# Patient Record
Sex: Female | Born: 1975 | Race: Black or African American | Hispanic: No | Marital: Single | Smoking: Former smoker
Health system: Southern US, Community
[De-identification: ages and names within clinical notes are randomized; demographics above are authoritative.]

## PROBLEM LIST (undated history)

## (undated) ENCOUNTER — Inpatient Hospital Stay (HOSPITAL_COMMUNITY): Payer: Self-pay

## (undated) DIAGNOSIS — R87619 Unspecified abnormal cytological findings in specimens from cervix uteri: Secondary | ICD-10-CM

## (undated) DIAGNOSIS — G932 Benign intracranial hypertension: Secondary | ICD-10-CM

## (undated) DIAGNOSIS — B999 Unspecified infectious disease: Secondary | ICD-10-CM

## (undated) DIAGNOSIS — IMO0002 Reserved for concepts with insufficient information to code with codable children: Secondary | ICD-10-CM

## (undated) HISTORY — PX: WISDOM TOOTH EXTRACTION: SHX21

## (undated) HISTORY — PX: COLPOSCOPY: SHX161

## (undated) HISTORY — PX: INDUCED ABORTION: SHX677

## (undated) HISTORY — PX: DILATION AND CURETTAGE OF UTERUS: SHX78

## (undated) HISTORY — PX: LUMBAR PUNCTURE: SHX1985

---

## 1998-10-12 ENCOUNTER — Emergency Department (HOSPITAL_COMMUNITY): Admission: EM | Admit: 1998-10-12 | Discharge: 1998-10-12 | Payer: Self-pay | Admitting: Emergency Medicine

## 1999-01-31 ENCOUNTER — Inpatient Hospital Stay: Admission: AD | Admit: 1999-01-31 | Discharge: 1999-01-31 | Payer: Self-pay | Admitting: Obstetrics

## 1999-03-03 ENCOUNTER — Inpatient Hospital Stay (HOSPITAL_COMMUNITY): Admission: AD | Admit: 1999-03-03 | Discharge: 1999-03-03 | Payer: Self-pay | Admitting: Obstetrics

## 1999-05-20 ENCOUNTER — Emergency Department (HOSPITAL_COMMUNITY): Admission: EM | Admit: 1999-05-20 | Discharge: 1999-05-20 | Payer: Self-pay | Admitting: *Deleted

## 1999-05-20 ENCOUNTER — Encounter: Payer: Self-pay | Admitting: Emergency Medicine

## 1999-05-26 ENCOUNTER — Emergency Department (HOSPITAL_COMMUNITY): Admission: EM | Admit: 1999-05-26 | Discharge: 1999-05-26 | Payer: Self-pay | Admitting: Emergency Medicine

## 1999-05-30 ENCOUNTER — Inpatient Hospital Stay (HOSPITAL_COMMUNITY): Admission: AD | Admit: 1999-05-30 | Discharge: 1999-05-30 | Payer: Self-pay | Admitting: Obstetrics

## 1999-12-30 ENCOUNTER — Inpatient Hospital Stay (HOSPITAL_COMMUNITY): Admission: AD | Admit: 1999-12-30 | Discharge: 1999-12-30 | Payer: Self-pay | Admitting: Obstetrics

## 2000-01-19 ENCOUNTER — Inpatient Hospital Stay (HOSPITAL_COMMUNITY): Admission: AD | Admit: 2000-01-19 | Discharge: 2000-01-19 | Payer: Self-pay | Admitting: Obstetrics

## 2000-02-12 ENCOUNTER — Inpatient Hospital Stay (HOSPITAL_COMMUNITY): Admission: AD | Admit: 2000-02-12 | Discharge: 2000-02-12 | Payer: Self-pay | Admitting: Obstetrics

## 2000-03-11 ENCOUNTER — Inpatient Hospital Stay (HOSPITAL_COMMUNITY): Admission: AD | Admit: 2000-03-11 | Discharge: 2000-03-11 | Payer: Self-pay | Admitting: Obstetrics

## 2000-04-21 ENCOUNTER — Encounter: Payer: Self-pay | Admitting: Emergency Medicine

## 2000-04-21 ENCOUNTER — Emergency Department (HOSPITAL_COMMUNITY): Admission: EM | Admit: 2000-04-21 | Discharge: 2000-04-21 | Payer: Self-pay | Admitting: Emergency Medicine

## 2000-04-24 ENCOUNTER — Emergency Department (HOSPITAL_COMMUNITY): Admission: EM | Admit: 2000-04-24 | Discharge: 2000-04-24 | Payer: Self-pay | Admitting: Emergency Medicine

## 2000-08-14 ENCOUNTER — Other Ambulatory Visit: Admission: RE | Admit: 2000-08-14 | Discharge: 2000-08-14 | Payer: Self-pay | Admitting: Obstetrics

## 2000-08-18 ENCOUNTER — Inpatient Hospital Stay (HOSPITAL_COMMUNITY): Admission: AD | Admit: 2000-08-18 | Discharge: 2000-08-18 | Payer: Self-pay | Admitting: Obstetrics

## 2000-10-17 ENCOUNTER — Emergency Department (HOSPITAL_COMMUNITY): Admission: EM | Admit: 2000-10-17 | Discharge: 2000-10-17 | Payer: Self-pay | Admitting: Emergency Medicine

## 2000-10-17 ENCOUNTER — Encounter: Payer: Self-pay | Admitting: Emergency Medicine

## 2001-10-01 ENCOUNTER — Inpatient Hospital Stay (HOSPITAL_COMMUNITY): Admission: AD | Admit: 2001-10-01 | Discharge: 2001-10-01 | Payer: Self-pay | Admitting: Obstetrics

## 2001-12-18 ENCOUNTER — Encounter (INDEPENDENT_AMBULATORY_CARE_PROVIDER_SITE_OTHER): Payer: Self-pay | Admitting: Specialist

## 2001-12-18 ENCOUNTER — Ambulatory Visit (HOSPITAL_COMMUNITY): Admission: RE | Admit: 2001-12-18 | Discharge: 2001-12-18 | Payer: Self-pay | Admitting: Obstetrics

## 2003-07-17 ENCOUNTER — Inpatient Hospital Stay (HOSPITAL_COMMUNITY): Admission: AD | Admit: 2003-07-17 | Discharge: 2003-07-17 | Payer: Self-pay | Admitting: *Deleted

## 2003-08-11 ENCOUNTER — Emergency Department (HOSPITAL_COMMUNITY): Admission: EM | Admit: 2003-08-11 | Discharge: 2003-08-11 | Payer: Self-pay | Admitting: Emergency Medicine

## 2003-09-18 ENCOUNTER — Inpatient Hospital Stay (HOSPITAL_COMMUNITY): Admission: AD | Admit: 2003-09-18 | Discharge: 2003-09-18 | Payer: Self-pay | Admitting: *Deleted

## 2006-11-27 ENCOUNTER — Ambulatory Visit (HOSPITAL_COMMUNITY): Admission: RE | Admit: 2006-11-27 | Discharge: 2006-11-27 | Payer: Self-pay | Admitting: Obstetrics

## 2006-11-27 ENCOUNTER — Encounter (INDEPENDENT_AMBULATORY_CARE_PROVIDER_SITE_OTHER): Payer: Self-pay | Admitting: Obstetrics

## 2007-08-19 ENCOUNTER — Inpatient Hospital Stay (HOSPITAL_COMMUNITY): Admission: AD | Admit: 2007-08-19 | Discharge: 2007-08-19 | Payer: Self-pay | Admitting: Obstetrics

## 2008-01-15 ENCOUNTER — Inpatient Hospital Stay (HOSPITAL_COMMUNITY): Admission: AD | Admit: 2008-01-15 | Discharge: 2008-01-15 | Payer: Self-pay | Admitting: Obstetrics & Gynecology

## 2008-02-12 ENCOUNTER — Inpatient Hospital Stay (HOSPITAL_COMMUNITY): Admission: AD | Admit: 2008-02-12 | Discharge: 2008-02-12 | Payer: Self-pay | Admitting: Obstetrics & Gynecology

## 2008-04-01 ENCOUNTER — Inpatient Hospital Stay (HOSPITAL_COMMUNITY): Admission: AD | Admit: 2008-04-01 | Discharge: 2008-04-01 | Payer: Self-pay | Admitting: Obstetrics

## 2008-05-22 ENCOUNTER — Ambulatory Visit: Payer: Self-pay | Admitting: Pulmonary Disease

## 2008-05-22 DIAGNOSIS — J309 Allergic rhinitis, unspecified: Secondary | ICD-10-CM | POA: Insufficient documentation

## 2008-05-22 DIAGNOSIS — R0602 Shortness of breath: Secondary | ICD-10-CM | POA: Insufficient documentation

## 2008-05-29 ENCOUNTER — Emergency Department (HOSPITAL_COMMUNITY): Admission: EM | Admit: 2008-05-29 | Discharge: 2008-05-29 | Payer: Self-pay | Admitting: Emergency Medicine

## 2008-06-01 ENCOUNTER — Emergency Department (HOSPITAL_COMMUNITY): Admission: EM | Admit: 2008-06-01 | Discharge: 2008-06-01 | Payer: Self-pay | Admitting: Emergency Medicine

## 2008-06-27 ENCOUNTER — Inpatient Hospital Stay (HOSPITAL_COMMUNITY): Admission: AD | Admit: 2008-06-27 | Discharge: 2008-07-03 | Payer: Self-pay | Admitting: Obstetrics

## 2008-06-30 ENCOUNTER — Encounter (INDEPENDENT_AMBULATORY_CARE_PROVIDER_SITE_OTHER): Payer: Self-pay | Admitting: Obstetrics

## 2008-10-27 ENCOUNTER — Emergency Department (HOSPITAL_COMMUNITY): Admission: EM | Admit: 2008-10-27 | Discharge: 2008-10-27 | Payer: Self-pay | Admitting: Emergency Medicine

## 2008-10-30 ENCOUNTER — Emergency Department (HOSPITAL_COMMUNITY): Admission: EM | Admit: 2008-10-30 | Discharge: 2008-10-30 | Payer: Self-pay | Admitting: Emergency Medicine

## 2009-02-04 ENCOUNTER — Emergency Department (HOSPITAL_COMMUNITY): Admission: EM | Admit: 2009-02-04 | Discharge: 2009-02-04 | Payer: Self-pay | Admitting: Emergency Medicine

## 2009-03-08 ENCOUNTER — Encounter: Admission: RE | Admit: 2009-03-08 | Discharge: 2009-05-05 | Payer: Self-pay | Admitting: Neurology

## 2009-08-09 ENCOUNTER — Emergency Department (HOSPITAL_COMMUNITY): Admission: EM | Admit: 2009-08-09 | Discharge: 2009-08-09 | Payer: Self-pay | Admitting: Emergency Medicine

## 2009-08-28 ENCOUNTER — Emergency Department (HOSPITAL_COMMUNITY): Admission: EM | Admit: 2009-08-28 | Discharge: 2009-08-28 | Payer: Self-pay | Admitting: Emergency Medicine

## 2009-11-26 ENCOUNTER — Emergency Department (HOSPITAL_COMMUNITY): Admission: EM | Admit: 2009-11-26 | Discharge: 2009-11-26 | Payer: Self-pay | Admitting: Emergency Medicine

## 2009-12-18 ENCOUNTER — Emergency Department (HOSPITAL_COMMUNITY): Admission: EM | Admit: 2009-12-18 | Discharge: 2009-12-18 | Payer: Self-pay | Admitting: Emergency Medicine

## 2010-02-18 ENCOUNTER — Emergency Department (HOSPITAL_COMMUNITY): Admission: EM | Admit: 2010-02-18 | Discharge: 2010-02-18 | Payer: Self-pay | Admitting: Emergency Medicine

## 2010-06-09 NOTE — Assessment & Plan Note (Signed)
Summary: consult for dyspnea   Visit Type:  Initial Consult Referred by:  Dr. Gaynell Face PCP:  Francoise Ceo  Chief Complaint:  pulm consult....SOB...coughing..has mold in apt..reviewed meds......  History of Present Illness: the pt is a 35 y/o female who comes in today for evaluation of dyspnea.  She has had breathing issues in the past, but no history of asthma.  She describes her breathing issue as "difficulty taking a deep breath", and states this has gotten a lot worse since being pregnant.  She denies any h/o childhood asthma, and has no significant cough.  She c/o 2 block doe at moderate pace prior to pregnancy, and now only one block.  Her breathing is worse upon lying down, and she will use a fan to blow into her face.  The pt admits to weighing greater than 200 pounds even prior to her pregnancy.  She denies any pleuritic chest pain, or calf tenderness.  A lot of her complaints center around nasal congestion.  I explained to her this is very common during pregnancy.     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 in pain?   no  Vitals Entered By: Clarise Cruz Duncan Dull) (May 22, 2008 10:42 AM)                  Physical Exam  General:  obese female in nad Eyes:     PERRLA and EOMI.   Nose:     very mild septal deviation to the left, mild turbinate hypertrophy. Mouth:     clear without exudates. Neck:     no jvd, tmg, ln Lungs:     totally clear to auscultation, but decreased depth of inspiration. no wheezing slight upper airway pseudowheezing Heart:     rrr, no mrg Abdomen:     IUP present.  nontender, bs+ Extremities:     large boots in place, no calf tenderness. Neurologic:     alert and oriented, moves all 4    Pulmonary Function Test Date: 05/22/2008 Height (in.): 63 Gender: Female  Pre-Spirometry FVC    Value: 2.70 L/min   Pred: 3.38 L/min     % Pred: 79 % FEV1    Value: 2.51 L     Pred: 2.90 L     % Pred: 86 % FEV1/FVC  Value: 93 %     Evaluation: normal  Impression & Recommendations:  Problem # 1:  DYSPNEA (ICD-786.05) I have found no pulmonary pathology on exam, spirometry, or cxr.  She has excellent oxygen saturations.  I suspect her feeling of sob is due to restriction of breathing associated with her IUP and obesity.  I have asked her to sit upright as much as possible, try sleeping with head more elevated, and to lose weight as quickly as possible once her baby is born.  I would be happy to re-assess if her condition worsens or new findings present themselves.  Medications Added to Medication List This Visit: 1)  Tylox 5-500 Mg Caps (Oxycodone-acetaminophen) .... As needed for headaches 2)  Diamox Sequels 500 Mg Xr12h-cap (Acetazolamide) .... Two times a day for fluid on the brain....brain tumor....stop due to pregnancy  Other Orders: T-2 View CXR, Same Day (71020.5TC)   Patient Instructions: 1)  work on weight loss aggressively after pregnancy 2)  can try sitting and sleeping in a more upright position during pregnancy

## 2010-08-12 LAB — URINALYSIS, ROUTINE W REFLEX MICROSCOPIC
Bilirubin Urine: NEGATIVE
Glucose, UA: NEGATIVE mg/dL
Ketones, ur: NEGATIVE mg/dL
Nitrite: NEGATIVE
Protein, ur: NEGATIVE mg/dL
Specific Gravity, Urine: 1.025 (ref 1.005–1.030)
Urobilinogen, UA: 1 mg/dL (ref 0.0–1.0)
pH: 6 (ref 5.0–8.0)

## 2010-08-12 LAB — URINE MICROSCOPIC-ADD ON

## 2010-08-12 LAB — POCT PREGNANCY, URINE: Preg Test, Ur: NEGATIVE

## 2010-08-15 LAB — COMPREHENSIVE METABOLIC PANEL
ALT: 19 U/L (ref 0–35)
AST: 26 U/L (ref 0–37)
Albumin: 3.4 g/dL — ABNORMAL LOW (ref 3.5–5.2)
Alkaline Phosphatase: 54 U/L (ref 39–117)
GFR calc Af Amer: >60 mL/min (ref >60)
Potassium: 3.6 mEq/L (ref 3.5–5.1)
Sodium: 141 mEq/L (ref 135–145)
Total Protein: 7.2 g/dL (ref 6.0–8.3)

## 2010-08-15 LAB — URINE MICROSCOPIC-ADD ON

## 2010-08-15 LAB — CBC
Hemoglobin: 13.5 g/dL (ref 12.0–15.0)
Platelets: 275 10*3/uL (ref 150–400)
RDW: 17.1 % — ABNORMAL HIGH (ref 11.5–15.5)

## 2010-08-15 LAB — DIFFERENTIAL
Basophils Relative: 0 % (ref 0–1)
Eosinophils Absolute: 0.1 10*3/uL (ref 0.0–0.7)
Lymphs Abs: 1.5 10*3/uL (ref 0.7–4.0)
Monocytes Absolute: 0.4 10*3/uL (ref 0.1–1.0)
Monocytes Relative: 7 % (ref 3–12)

## 2010-08-15 LAB — URINALYSIS, ROUTINE W REFLEX MICROSCOPIC
Glucose, UA: NEGATIVE mg/dL
Hgb urine dipstick: NEGATIVE
Ketones, ur: NEGATIVE mg/dL
Protein, ur: 30 mg/dL — AB
pH: 8.5 — ABNORMAL HIGH (ref 5.0–8.0)

## 2010-08-15 LAB — RAPID URINE DRUG SCREEN, HOSP PERFORMED
Amphetamines: NOT DETECTED
Cocaine: POSITIVE — AB
Opiates: NOT DETECTED
Tetrahydrocannabinol: POSITIVE — AB

## 2010-08-15 LAB — POCT CARDIAC MARKERS
CKMB, poc: <1 ng/mL — ABNORMAL LOW (ref 1.0–8.0)
Myoglobin, poc: 44.7 ng/mL (ref 12–200)
Myoglobin, poc: 69.2 ng/mL (ref 12–200)

## 2010-08-23 LAB — CBC
HCT: 24.7 % — ABNORMAL LOW (ref 36.0–46.0)
HCT: 28.6 % — ABNORMAL LOW (ref 36.0–46.0)
HCT: 36 % (ref 36.0–46.0)
HCT: 36.1 % (ref 36.0–46.0)
Hemoglobin: 12.2 g/dL (ref 12.0–15.0)
MCHC: 33.4 g/dL (ref 30.0–36.0)
MCHC: 33.5 g/dL (ref 30.0–36.0)
MCV: 90.5 fL (ref 78.0–100.0)
MCV: 90.9 fL (ref 78.0–100.0)
MCV: 91.7 fL (ref 78.0–100.0)
MCV: 92.2 fL (ref 78.0–100.0)
Platelets: 150 10*3/uL (ref 150–400)
Platelets: 152 10*3/uL (ref 150–400)
Platelets: 179 10*3/uL (ref 150–400)
Platelets: 179 10*3/uL (ref 150–400)
Platelets: 187 10*3/uL (ref 150–400)
RDW: 15 % (ref 11.5–15.5)
RDW: 15.5 % (ref 11.5–15.5)
WBC: 10.9 10*3/uL — ABNORMAL HIGH (ref 4.0–10.5)
WBC: 8 10*3/uL (ref 4.0–10.5)

## 2010-08-23 LAB — DIFFERENTIAL
Eosinophils Relative: 1 % (ref 0–5)
Lymphocytes Relative: 28 % (ref 12–46)
Lymphs Abs: 2.3 10*3/uL (ref 0.7–4.0)

## 2010-08-23 LAB — COMPREHENSIVE METABOLIC PANEL
ALT: 20 U/L (ref 0–35)
AST: 22 U/L (ref 0–37)
AST: 32 U/L (ref 0–37)
Albumin: 1.7 g/dL — ABNORMAL LOW (ref 3.5–5.2)
Albumin: 1.8 g/dL — ABNORMAL LOW (ref 3.5–5.2)
Albumin: 2.2 g/dL — ABNORMAL LOW (ref 3.5–5.2)
Alkaline Phosphatase: 87 U/L (ref 39–117)
BUN: 7 mg/dL (ref 6–23)
BUN: 8 mg/dL (ref 6–23)
CO2: 27 mEq/L (ref 19–32)
Calcium: 7.3 mg/dL — ABNORMAL LOW (ref 8.4–10.5)
Calcium: 7.9 mg/dL — ABNORMAL LOW (ref 8.4–10.5)
Chloride: 105 mEq/L (ref 96–112)
Creatinine, Ser: 0.71 mg/dL (ref 0.4–1.2)
Creatinine, Ser: 0.74 mg/dL (ref 0.4–1.2)
Creatinine, Ser: 0.82 mg/dL (ref 0.4–1.2)
GFR calc Af Amer: >60 mL/min (ref >60)
GFR calc Af Amer: >60 mL/min (ref >60)
GFR calc non Af Amer: >60 mL/min (ref >60)
Potassium: 3.8 mEq/L (ref 3.5–5.1)
Potassium: 4.1 mEq/L (ref 3.5–5.1)
Sodium: 136 mEq/L (ref 135–145)
Sodium: 136 mEq/L (ref 135–145)
Total Bilirubin: 0.2 mg/dL — ABNORMAL LOW (ref 0.3–1.2)
Total Bilirubin: 0.4 mg/dL (ref 0.3–1.2)
Total Protein: 3.9 g/dL — ABNORMAL LOW (ref 6.0–8.3)
Total Protein: 4.6 g/dL — ABNORMAL LOW (ref 6.0–8.3)
Total Protein: 5.4 g/dL — ABNORMAL LOW (ref 6.0–8.3)

## 2010-08-23 LAB — URINALYSIS, ROUTINE W REFLEX MICROSCOPIC
Glucose, UA: NEGATIVE mg/dL
Leukocytes, UA: NEGATIVE
Protein, ur: >300 mg/dL — AB
Urobilinogen, UA: 0.2 mg/dL (ref 0.0–1.0)

## 2010-08-23 LAB — URINE MICROSCOPIC-ADD ON

## 2010-08-23 LAB — CREATININE CLEARANCE, URINE, 24 HOUR
Creatinine Clearance: 163 mL/min — ABNORMAL HIGH (ref 75–115)
Creatinine, 24H Ur: 1668 mg/d (ref 700–1800)
Urine Total Volume-CRCL: 3100 mL

## 2010-08-23 LAB — URIC ACID
Uric Acid, Serum: 4.9 mg/dL (ref 2.4–7.0)
Uric Acid, Serum: 7.1 mg/dL — ABNORMAL HIGH (ref 2.4–7.0)

## 2010-08-23 LAB — RUBELLA SCREEN: Rubella: 27.1 IU/mL — ABNORMAL HIGH

## 2010-08-23 LAB — TYPE AND SCREEN: ABO/RH(D): B POS

## 2010-08-23 LAB — RPR: RPR Ser Ql: NONREACTIVE

## 2010-08-23 LAB — PROTEIN, URINE, 24 HOUR
Collection Interval-UPROT: 24 hours
Protein, Urine: 148 mg/dL
Urine Total Volume-UPROT: 3100 mL

## 2010-08-23 LAB — HEPATITIS B SURFACE ANTIGEN: Hepatitis B Surface Ag: NEGATIVE

## 2010-09-20 NOTE — H&P (Signed)
Beverly Martinez, Beverly Martinez                 ACCOUNT NO.:  [id number]   MEDICAL RECORD NO.:  [id number]          PATIENT TYPE:  INP   LOCATION:  9372                          [facility]   PHYSICIAN:  Kathreen Cosier, M.D.DATE OF BIRTH:  12/31/1975   DATE OF ADMISSION:  06/27/2008  DATE OF DISCHARGE:                              HISTORY & PHYSICAL   The patient is a 35 year old gravida 5, para 1, EDC August 15, 2008.  The  patient was admitted with 3+ edema and elevated blood pressures.  She  had a previous history of preeclampsia at 36 weeks having a 2-pound-10-  ounce baby by C-section.   On admission, her uric acid was 4.9, SGOT 16, SGPT 14, LDH 172.  A 24-  hour urine collection revealed 4.5 g of protein.  She was started on  labetalol 200 mg p.o. b.i.d.  She also received betamethasone 12.5 IM  x2.  The last dose was on June 28, 2008.  She was seen in consult by  Maternal Fetal Medicine.  By June 29, 2008, her diastolics were 90-  98.  Ultrasound revealed estimated fetal weight of 4 pounds 3 ounces.  By June 30, 2008, at 6:00 a.m., blood pressures at 177/93, 182/94,  180/110 by midday.  She also complained of a severe headache, which was  not relieved with Tylenol with Codeine.  The patient also has a history  of pseudotumor cerebri and was seen by a neurologist in [facility] [place] and  she had had a number of spinal taps done.  At one time, she was also on  Diamox 250 p.o. b.i.d.; however, in the past month, the patient states  that her headaches have disappeared.  The headache that appeared on the  morning of June 30, 2008, could have possibly been due to her PIH or  her pseudotumor cerebri, so  it was decided she would be delivered by  repeat C-section.   PHYSICAL EXAMINATION:  GENERAL:  An obese female not in distress.  HEENT:  Negative.  LUNGS:  Clear.  HEART:  Regular rhythm.  No murmurs or gallops.  BREASTS:  No masses.  ABDOMEN:  34-36 weeks' size.  EXTREMITIES:  3+ edema.           ______________________________  Kathreen Cosier, M.D.     BAM/MEDQ  D:  06/30/2008  T:  07/01/2008  Job:  [id number]

## 2010-09-20 NOTE — Op Note (Signed)
NAMESOPHIE, Beverly Martinez                 ACCOUNT NO.:  [id number]   MEDICAL RECORD NO.:  [id number]           PATIENT TYPE:   LOCATION:                                 FACILITY:   PHYSICIAN:  Kathreen Cosier, M.D.DATE OF BIRTH:  09-16-1975   DATE OF PROCEDURE:  06/30/2008  DATE OF DISCHARGE:                               OPERATIVE REPORT   PREOPERATIVE DIAGNOSES:  1. Pregnancy-induced hypertension at 33 plus weeks.  2. History of pseudotumor cerebri.   POSTOPERATIVE DIAGNOSES:  1. Pregnancy-induced hypertension at 33 plus weeks.  2. History of pseudotumor cerebri.   SURGEON:  Kathreen Cosier, MD   FIRST ASSISTANT:  Charles A. Clearance Coots, MD   PROCEDURE:  After the spinal administered, the patient was placed in the  supine position.  Abdomen was prepped and draped.  Bladder was emptied  with Foley catheter.  Transverse suprapubic incision was made through  the old scar, carried down through rectus fascia.  Fascia was cleaned  and incised the length of incision.  Recti muscles were retracted  laterally.  Peritoneum was incised longitudinally.  Transverse incision  was made in the visceral peritoneum above the bladder.  Bladder  mobilized inferiorly.  Transverse lower uterine incision made.  The  patient delivered of a female, Apgar 8 and 9, weighing 3 pounds 10  ounces.  The fluid was clear.  The placenta was posterior and sent to  Pathology.  Uterine cavity was cleaned with dry laps.  Uterine incision  closed with interlocking suture of #1 chromic.  Hemostasis satisfactory.  Bladder flap reattached with 2-0 chromic.  Uterus was well contracted.  Tubes and ovaries normal.  Abdomen closed in layers, peritoneum closed  with continuous suture of 0 chromic, fascia continuous suture of 0  Dexon, and the skin closed with subcuticular suture of 4-0 Monocryl.   BLOOD LOSS:  500 mL.           ______________________________  Kathreen Cosier, M.D.     BAM/MEDQ  D:  06/30/2008   T:  07/01/2008  Job:  [id number]

## 2010-09-23 NOTE — Op Note (Signed)
   [name], Beverly Martinez                             ACCOUNT NO.:  [id number]   MEDICAL RECORD NO.:  [id number]                   PATIENT TYPE:  AMB   LOCATION:  SDC                                  [facility]  [facility]   PHYSICIAN:  Kathreen Cosier, M.D.           DATE OF BIRTH:  Sep 25, 1975   DATE OF PROCEDURE:  12/18/2001  DATE OF DISCHARGE:                                 OPERATIVE REPORT   PREOPERATIVE DIAGNOSIS:  Severe dysplasia of the cervix.   PROCEDURE:  Cold knife conization.   Under general anesthesia with the patient  in the lithotomy position the  perineum and vagina prepped and draped. Bladder emptied with a straight  catheter. A weighted speculum was placed in the vagina. Hemostatic suture of  #1 chromic was placed at 3 and 9 o'clock high up on the lateral aspect of  the cervix then a cold knife cone done in the usual manner. Hemostasis was  achieved with suture of #1 around the cervix. The cervical canal was sounded  and found to be patent. The patient tolerated the procedure well and taken  to the recovery room in good condition.                                               Kathreen Cosier, M.D.    BAM/MEDQ  D:  12/18/2001  T:  12/19/2001  Job:  [id number]

## 2010-09-23 NOTE — Discharge Summary (Signed)
[name], [name]                 ACCOUNT NO.:  [id number]   MEDICAL RECORD NO.:  [id number]          PATIENT TYPE:  INP   LOCATION:  9309                          [facility]   PHYSICIAN:  Kathreen Cosier, M.D.DATE OF BIRTH:  28-Aug-1975   DATE OF ADMISSION:  06/27/2008  DATE OF DISCHARGE:  07/03/2008                               DISCHARGE SUMMARY   The patient is a 35 year old gravida 5, para 1, EDC on April 2010. She  had a previous C-section because of preeclampsia at 36 weeks and had 2  pounds 10 ounces baby.  She was now admitted with 3+ edema and elevated  blood pressures.  Her uric acid was 4.9, SGOT 16, PT 14, and LDH 172.  A  24-hour urine collection revealed 4.5 grams of protein and she was  started on labetalol 200 p.o. b.i.d.  She received betamethasone 12.5 IM  x2 and she was seen in consult by MFM.  She also had a history of  pseudotumor cerebri and throughout the pregnancy had received a number  of spinal taps by the neurologist in [facility].  Her headaches have  resolved.  By February 23, her diastolics were over 100 and she had a  severe headache which could have been due to [facility] or pseudotumor.  It was  decided to deliver by repeat C-section and she had a female, Apgar 8 and  9, weighing 3 pounds 10 ounces.  She was placed on Diamox 250 p.o.  b.i.d., labetalol 200 b.i.d. and her magnesium was discontinued on day  1.  By date 2, blood pressure was 120/78, output good.  She still had 3+  edema.  Diamox was discontinued and she was discharged on the third  postoperative day on labetalol 200 p.o. b.i.d., Tylox for pain, and to  see me in 1 week for blood pressure check.   DISCHARGE DIAGNOSIS:  Status post delivery by C-section for pregnancy-  induced hypertension.           ______________________________  Kathreen Cosier, M.D.     BAM/MEDQ  D:  07/22/2008  T:  07/22/2008  Job:  [id number]

## 2010-09-23 NOTE — Op Note (Signed)
[name], Beverly Martinez                 ACCOUNT NO.:  [id number]   MEDICAL RECORD NO.:  [id number]          PATIENT TYPE:  AMB   LOCATION:  SDC                           [facility]   PHYSICIAN:  Kathreen Cosier, M.D.DATE OF BIRTH:  November 22, 1975   DATE OF PROCEDURE:  12/19/2006  DATE OF DISCHARGE:  11/27/2006                               OPERATIVE REPORT   PREOPERATIVE DIAGNOSIS:  Severe dysplasia of cervix.   PROCEDURE:  Cold knife cone.   DESCRIPTION OF PROCEDURE:  Under general anesthesia the patient in  lithotomy position, perineum and vagina prepped and draped, bladder  emptied straight catheter.  Weighted speculum placed in the vagina.  Cervix injected with 10 mL of 1% Xylocaine and a hemostatic suture of #1  chromic placed high up on the lateral aspect of the cervix at 3 o'clock  and 9 o'clock.  Cold knife cone was done in the usual fashion and  hemostasis achieved with U sutures placed around the cervix.  The  patient tolerated the procedure well, taken to recovery room in good  condition.           ______________________________  Kathreen Cosier, M.D.     BAM/MEDQ  D:  12/19/2006  T:  12/19/2006  Job:  [id number]

## 2010-11-20 ENCOUNTER — Emergency Department (HOSPITAL_COMMUNITY)
Admission: EM | Admit: 2010-11-20 | Discharge: 2010-11-20 | Disposition: A | Discharge status: Home / Self Care | Payer: Self-pay | Attending: Emergency Medicine | Admitting: Emergency Medicine

## 2010-11-20 DIAGNOSIS — G510 Bell's palsy: Secondary | ICD-10-CM | POA: Insufficient documentation

## 2010-11-20 DIAGNOSIS — R29810 Facial weakness: Secondary | ICD-10-CM | POA: Insufficient documentation

## 2011-01-31 LAB — POCT PREGNANCY, URINE
Operator id: 140111
Preg Test, Ur: NEGATIVE

## 2011-01-31 LAB — GC/CHLAMYDIA PROBE AMP, GENITAL
Chlamydia, DNA Probe: NEGATIVE
GC Probe Amp, Genital: NEGATIVE

## 2011-02-08 LAB — WET PREP, GENITAL
Clue Cells Wet Prep HPF POC: NONE SEEN
Trich, Wet Prep: NONE SEEN
Yeast Wet Prep HPF POC: NONE SEEN

## 2011-02-08 LAB — DIFFERENTIAL
Eosinophils Absolute: 0.2
Eosinophils Relative: 3
Lymphs Abs: 1.2
Monocytes Relative: 11
Neutrophils Relative %: 66

## 2011-02-08 LAB — URINALYSIS, ROUTINE W REFLEX MICROSCOPIC
Bilirubin Urine: NEGATIVE
Hgb urine dipstick: NEGATIVE
Specific Gravity, Urine: 1.025
pH: 6.5

## 2011-02-08 LAB — CBC
HCT: 39.2
MCV: 91
RBC: 4.31
WBC: 6.2

## 2011-02-08 LAB — GC/CHLAMYDIA PROBE AMP, GENITAL: Chlamydia, DNA Probe: NEGATIVE

## 2011-02-20 LAB — PREGNANCY, URINE: Preg Test, Ur: NEGATIVE

## 2011-02-20 LAB — CBC
MCHC: 33.3
Platelets: 293
RBC: 4.53

## 2011-08-15 ENCOUNTER — Emergency Department (HOSPITAL_COMMUNITY)
Admission: EM | Admit: 2011-08-15 | Discharge: 2011-08-15 | Disposition: A | Discharge status: Home / Self Care | Payer: Self-pay | Attending: Emergency Medicine | Admitting: Emergency Medicine

## 2011-08-15 ENCOUNTER — Encounter (HOSPITAL_COMMUNITY): Payer: Self-pay

## 2011-08-15 DIAGNOSIS — R197 Diarrhea, unspecified: Secondary | ICD-10-CM | POA: Insufficient documentation

## 2011-08-15 DIAGNOSIS — R109 Unspecified abdominal pain: Secondary | ICD-10-CM | POA: Insufficient documentation

## 2011-08-15 DIAGNOSIS — R112 Nausea with vomiting, unspecified: Secondary | ICD-10-CM | POA: Insufficient documentation

## 2011-08-15 LAB — DIFFERENTIAL
Basophils Absolute: 0 10*3/uL (ref 0.0–0.1)
Basophils Relative: 0 % (ref 0–1)
Lymphocytes Relative: 22 % (ref 12–46)
Monocytes Relative: 6 % (ref 3–12)
Neutro Abs: 6.4 10*3/uL (ref 1.7–7.7)
Neutrophils Relative %: 70 % (ref 43–77)

## 2011-08-15 LAB — BASIC METABOLIC PANEL
BUN: 8 mg/dL (ref 6–23)
Chloride: 103 mEq/L (ref 96–112)
GFR calc Af Amer: >90 mL/min (ref >90)
GFR calc non Af Amer: >90 mL/min (ref >90)
Potassium: 3.4 mEq/L — ABNORMAL LOW (ref 3.5–5.1)
Sodium: 137 mEq/L (ref 135–145)

## 2011-08-15 LAB — URINALYSIS, ROUTINE W REFLEX MICROSCOPIC
Glucose, UA: NEGATIVE mg/dL
Ketones, ur: NEGATIVE mg/dL
Leukocytes, UA: NEGATIVE
Nitrite: NEGATIVE
Specific Gravity, Urine: 1.025 (ref 1.005–1.030)
pH: 6 (ref 5.0–8.0)

## 2011-08-15 LAB — CBC
Hemoglobin: 13.3 g/dL (ref 12.0–15.0)
MCHC: 33.3 g/dL (ref 30.0–36.0)
RDW: 13.8 % (ref 11.5–15.5)
WBC: 9.1 10*3/uL (ref 4.0–10.5)

## 2011-08-15 LAB — PREGNANCY, URINE: Preg Test, Ur: NEGATIVE

## 2011-08-15 MED ORDER — NAPROXEN 500 MG PO TABS
500.0000 mg | ORAL_TABLET | Freq: Once | ORAL | Status: AC
  Administered 2011-08-15: 500 mg via ORAL
  Filled 2011-08-15: qty 1

## 2011-08-15 MED ORDER — ONDANSETRON 8 MG PO TBDP: 8.0000 mg | ORAL_TABLET | Freq: Three times a day (TID) | ORAL | Status: AC | PRN

## 2011-08-15 NOTE — ED Provider Notes (Signed)
History     CSN: [id number]  Arrival date & time 08/15/11  1444   First MD Initiated Contact with Patient 08/15/11 1830      Chief Complaint  Patient presents with  . Abdominal Pain  . Diarrhea    (Consider location/radiation/quality/duration/timing/severity/associated sxs/prior treatment) HPI Comments: Patient presents with diarrhea and lower abdominal cramping since this morning. Patient also had an episode of nonbloody vomiting. Patient noticed some streaks of blood in her stool mixed with mucus. She denies recent antibiotic use, travel. Patient denies fever, cough or shortness of breath, chest pain. She denies dysuria. 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 Patient has not vomited since her arrival to the emergency department. She states that her nausea is resolved. She states that her abdominal cramping is improving. She has been able to keep down fluids.  Patient is a 36 y.o. female presenting with abdominal pain and diarrhea. The history is provided by the patient.  Abdominal Pain The primary symptoms of the illness include abdominal pain, nausea, vomiting and diarrhea. The primary symptoms of the illness do not include fever, shortness of breath, dysuria, vaginal discharge or vaginal bleeding. The current episode started 6 to 12 hours ago. The problem has been gradually improving.  Symptoms associated with the illness do not include constipation.  Diarrhea The primary symptoms include abdominal pain, nausea, vomiting and diarrhea. Primary symptoms do not include fever, dysuria, myalgias or rash.  The illness does not include constipation.    History reviewed. No pertinent past medical history.  History reviewed. No pertinent past surgical history.  No family history on file.  History  Substance Use Topics  . Smoking status: Current Everyday Smoker  . Smokeless tobacco: Not on file  . Alcohol Use: No    OB History    Grav  Para Term Preterm Abortions TAB SAB Ect Mult Living                  Review of Systems  Constitutional: Negative for fever.  HENT: Negative for sore throat and rhinorrhea.   Eyes: Negative for redness.  Respiratory: Negative for cough and shortness of breath.   Cardiovascular: Negative for chest pain.  Gastrointestinal: Positive for nausea, vomiting, abdominal pain, diarrhea and blood in stool. Negative for constipation.  Genitourinary: Negative for dysuria, vaginal bleeding and vaginal discharge.  Musculoskeletal: Negative for myalgias.  Skin: Negative for rash.  Neurological: Negative for headaches.    Allergies  Review of patient's allergies indicates no known allergies.  Home Medications  No current outpatient prescriptions on file.  BP 142/99  Pulse 86  Temp(Src) 98.6 F (37 C) (Oral)  Resp 20  SpO2 100%  LMP 07/22/2011  Physical Exam  Nursing note and vitals reviewed. Constitutional: She is oriented to person, place, and time. She appears well-developed and well-nourished.  HENT:  Head: Normocephalic and atraumatic.  Eyes: Conjunctivae are normal. Right eye exhibits no discharge. Left eye exhibits no discharge.  Neck: Normal range of motion. Neck supple.  Cardiovascular: Normal rate, regular rhythm and normal heart sounds.   Pulmonary/Chest: Effort normal and breath sounds normal.  Abdominal: Soft. Bowel sounds are normal. She exhibits no distension. There is no tenderness. There is no rebound and no guarding.  Neurological: She is alert and oriented to person, place, and time.  Skin: Skin is warm and dry.  Psychiatric: She has a normal mood and affect.    ED Course  Procedures (including critical care  time)  Labs Reviewed  BASIC METABOLIC PANEL - Abnormal; Notable for the following:    Potassium 3.4 (*)    All other components within normal limits  URINALYSIS, ROUTINE W REFLEX MICROSCOPIC  PREGNANCY, URINE  CBC  DIFFERENTIAL  LIPASE, BLOOD   No  results found.   1. Nausea vomiting and diarrhea     6:48 PM Patient seen and examined.    Vital signs reviewed and are as follows: Filed Vitals:   08/15/11 1454  BP: 142/99  Pulse: 86  Temp: 98.6 F (37 C)  Resp: 20   All labs ordered per ED protocol. Patient is improving. Will give oral trial and aleve for cramps. Anticipate d/c to home if she can tolerate.   8:11 PM Patient drank ginger ale without problems. Counseled on clear liquids, brat diet.   The patient was urged to return to the Emergency Department immediately with worsening of current symptoms, worsening abdominal pain, persistent vomiting, blood noted in stools, fever, or any other concerns. The patient verbalized understanding.   MDM  Patient with 12 hr h/o N/V/D, improving. Tolerating POs in ED. Will continue conservative management with return if worsening. No significant abdominal tenderness of exam. Does not appear clinically dehydrated. Do not suspect significant blood loss. Do not suspect IBD.         Renne Crigler, [place] 08/15/11 2013

## 2011-08-15 NOTE — ED Provider Notes (Signed)
Medical screening examination/treatment/procedure(s) were performed by non-physician practitioner and as supervising physician I was immediately available for consultation/collaboration.   Celene Kras, MD 08/15/11 [phone]

## 2011-08-15 NOTE — ED Notes (Signed)
Pt states acute onset of abd pain and diarrhea starting this am states unable to eat

## 2011-08-15 NOTE — ED Notes (Signed)
Report received from [facility].  Pt tolerating fluids well at this time.  Denies any needs or c/o.

## 2011-08-15 NOTE — Discharge Instructions (Signed)
Please read and follow all provided instructions.  Your diagnoses today include:  1. Nausea vomiting and diarrhea     Tests performed today include:  Blood counts and electrolytes  Blood tests to check liver and kidney function  Blood tests to check pancreas function  Urine test to look for infection and pregnancy (in women)  Vital signs. See below for your results today.   Medications prescribed:   Zofran (ondansetron) - for nausea and vomiting  Take any prescribed medications only as directed.  Home care instructions:   Follow any educational materials contained in this packet.   Your abdominal pain, nausea, vomiting, and diarrhea may be caused by a viral gastroenteritis also called 'stomach flu'. You should rest for the next several days. Keep drinking plenty of fluids and use the medicine for nausea as directed.    Drink clear liquids for the next 24 hours and introduce solid foods slowly after 24 hours using the b.r.a.t. diet (see attached information sheet).    Follow-up instructions: Please follow-up with your primary care provider in the next 2 days for further evaluation of your symptoms. If you are not feeling better in 48 hours you may have a condition that is more serious and you need re-evaluation. If you do not have a primary care doctor -- see below for referral information.   Return instructions:  SEEK IMMEDIATE MEDICAL ATTENTION IF:  If you have pain that does not go away or becomes severe   A temperature above 101F develops   Repeated vomiting occurs (multiple episodes)   If you have pain that becomes localized to portions of the abdomen. The right side could possibly be appendicitis. In an adult, the left lower portion of the abdomen could be colitis or diverticulitis.   Blood is being passed in stools or vomit (bright red or black tarry stools)   You develop chest pain, difficulty breathing, dizziness or fainting, or become confused, poorly  responsive, or inconsolable (young children)  If you have any other emergent concerns regarding your health  Additional Information: Abdominal (belly) pain can be caused by many things. Your caregiver performed an examination and possibly ordered blood/urine tests and imaging (CT scan, x-rays, ultrasound). Many cases can be observed and treated at home after initial evaluation in the emergency department. Even though you are being discharged home, abdominal pain can be unpredictable. Therefore, you need a repeated exam if your pain does not resolve, returns, or worsens. Most patients with abdominal pain don't have to be admitted to the hospital or have surgery, but serious problems like appendicitis and gallbladder attacks can start out as nonspecific pain. Many abdominal conditions cannot be diagnosed in one visit, so follow-up evaluations are very important.  Your vital signs today were: BP 142/99  Pulse 86  Temp(Src) 98.6 F (37 C) (Oral)  Resp 20  SpO2 100%  LMP 07/22/2011 If your blood pressure (bp) was elevated above 135/85 this visit, please have this repeated by your doctor within one month. -------------- No Primary Care Doctor Call [facility]  [phone] Other agencies that provide inexpensive medical care    [facility]  [phone]    [facility]  [phone]    [facility]  [phone]    [facility]  [phone]    [facility]  [phone]    [facility]  [phone] -------------- RESOURCE GUIDE:  Dental Problems  Patients with Medicaid: [facility]  [facility] [address]                                            [phone] [address] [employer] [address] Phone:  [phone]                                                   Phone:  [phone]  If unable to pay or uninsured, contact:  [facility] or [facility]. to become qualified for the adult dental clinic.  Chronic Pain  Problems Contact [facility]  [phone] Patients need to be referred by their primary care doctor.  Insufficient Money for Medicine Contact [employer]:  call "211" or [facility] 9065106538.  Psychological Services [facility]  [phone] [facility]  [phone] [facility]   [phone] (emergency services [phone])  Substance Abuse Resources [facility]  [phone] [facility] [phone] [facility] [phone] Floydene Flock [phone] Residential & Outpatient Substance Abuse Program  [phone]  Abuse/Neglect [facility] Child Abuse Hotline [phone] [facility] Child Abuse Hotline 301-559-0044 (After Hours)  Emergency [facility]ter [facility] Ministries [phone]  M[facility] Homes [facility] [place] [facility] [phone] [facility] [phone]  [facility] Resources  [facility]     [employer]                          [facility] Dept. [address] [place]                       [address]      [address]  Blondell Reveal Phone:  [phone]                                   Phone:  [phone]                 Phone:  [phone]  [facility] Phone:  [phone]  [facility] Child Abuse Hotline [phone] (After Hours)

## 2011-09-30 ENCOUNTER — Emergency Department (HOSPITAL_COMMUNITY)
Admission: EM | Admit: 2011-09-30 | Discharge: 2011-10-01 | Disposition: A | Discharge status: Home / Self Care | Payer: Self-pay | Attending: Emergency Medicine | Admitting: Emergency Medicine

## 2011-09-30 ENCOUNTER — Encounter (HOSPITAL_COMMUNITY): Payer: Self-pay | Admitting: Emergency Medicine

## 2011-09-30 DIAGNOSIS — L309 Dermatitis, unspecified: Secondary | ICD-10-CM

## 2011-09-30 DIAGNOSIS — F17200 Nicotine dependence, unspecified, uncomplicated: Secondary | ICD-10-CM | POA: Insufficient documentation

## 2011-09-30 DIAGNOSIS — R21 Rash and other nonspecific skin eruption: Secondary | ICD-10-CM | POA: Insufficient documentation

## 2011-09-30 HISTORY — DX: Benign intracranial hypertension: G93.2

## 2011-09-30 NOTE — ED Provider Notes (Signed)
History     CSN: [id number]  Arrival date & time 09/30/11  2334   First MD Initiated Contact with Patient 09/30/11 2347      Chief Complaint  Patient presents with  . Rash    (Consider location/radiation/quality/duration/timing/severity/associated sxs/prior treatment) HPI  36 year old female presents complaining of rash. Patient states for the past month she has been having a rash to both arms and chest. Rash as very itchy. Onset was gradual and persistent. Rash is not associated with fever, sore throat, throat swelling, chest pain, shortness of breath, nausea, vomiting. She denies any changes in detergent, or having new pets, or new medication change. She has had similar rash in the past and was diagnosed with eczema. States she was given steroid cream to use last year but it did not help. Sts rash from last year lasted for 2 months then resolved. 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 She denies history of asthma, hx of allergy or history of atopic dermatitis.  No past medical history on file.  No past surgical history on file.  No family history on file.  History  Substance Use Topics  . Smoking status: Current Everyday Smoker  . Smokeless tobacco: Not on file  . Alcohol Use: No    OB History    Grav Para Term Preterm Abortions TAB SAB Ect Mult Living                  Review of Systems  All other systems reviewed and are negative.    Allergies  Review of patient's allergies indicates no known allergies.  Home Medications  No current outpatient prescriptions on file.  LMP 09/13/2011  Physical Exam  Nursing note and vitals reviewed. Constitutional: She appears well-developed and well-nourished. No distress.       Awake, alert, nontoxic appearance  HENT:  Head: Atraumatic.  Mouth/Throat: Oropharynx is clear and moist.  Eyes: Conjunctivae are normal. Right eye exhibits no discharge. Left eye exhibits no discharge.    Neck: Neck supple.  Cardiovascular: Normal rate and regular rhythm.   Pulmonary/Chest: Effort normal. No respiratory distress. She has no wheezes. She exhibits no tenderness.  Abdominal: Soft. There is no tenderness. There is no rebound.  Musculoskeletal: She exhibits no tenderness.       ROM appears intact, no obvious focal weakness  Neurological:       Mental status and motor strength appears intact  Skin: Rash noted.       Patchy hyperpigmented rash noted to both arms and upper chest/neck region.  Non petechial, non pustular, non vesicular.  Excoriation marks noted.    Psychiatric: She has a normal mood and affect.    ED Course  Procedures (including critical care time)  Labs Reviewed - No data to display No results found.   No diagnosis found.    MDM  Rash consistent with eczema.  Will give betamethasone cream and short course of oral steroid.  Pt does not have hx of DM or GERD.  VSS, normal oxygenation.  No resp involvement.          Fayrene Helper, PA-C 10/01/11 0005

## 2011-09-30 NOTE — ED Notes (Signed)
Pt presented to the Er with c/o generalized rash and eye redness. Pt states she was seen by her dermatologist, however, rash is not subsiding.

## 2011-09-30 NOTE — ED Notes (Signed)
Pt c/o rash to bilat arms and neck, onset last year. Pt dx with eczema in past.

## 2011-10-01 MED ORDER — BETAMETHASONE DIPROPIONATE 0.05 % EX OINT: TOPICAL_OINTMENT | Freq: Two times a day (BID) | CUTANEOUS | Status: DC

## 2011-10-01 MED ORDER — PREDNISONE 10 MG PO TABS: 20.0000 mg | ORAL_TABLET | Freq: Every day | ORAL | Status: DC

## 2011-10-01 NOTE — ED Provider Notes (Signed)
Medical screening examination/treatment/procedure(s) were performed by non-physician practitioner and as supervising physician I was immediately available for consultation/collaboration.   Hanley Seamen, MD 10/01/11 [phone]

## 2011-10-01 NOTE — ED Notes (Signed)
Pt c/o rash intermittent x 1 year, pt diagnosed with eczema by derm, given cream to use, pt very upset, does not feel this is eczema. Pt tearful, PA spent extended time with patient explaining anxiety and how this relates to rash exacerbation. Pt verbalized understanding .

## 2011-10-01 NOTE — Discharge Instructions (Signed)

## 2011-10-18 ENCOUNTER — Encounter: Payer: Self-pay | Admitting: Obstetrics & Gynecology

## 2011-10-18 ENCOUNTER — Encounter: Payer: Self-pay | Admitting: Physician Assistant

## 2011-10-20 ENCOUNTER — Emergency Department (HOSPITAL_COMMUNITY)
Admission: EM | Admit: 2011-10-20 | Discharge: 2011-10-21 | Disposition: A | Discharge status: Home / Self Care | Payer: Medicaid Other | Attending: Emergency Medicine | Admitting: Emergency Medicine

## 2011-10-20 DIAGNOSIS — N760 Acute vaginitis: Secondary | ICD-10-CM | POA: Insufficient documentation

## 2011-10-20 DIAGNOSIS — F17200 Nicotine dependence, unspecified, uncomplicated: Secondary | ICD-10-CM | POA: Insufficient documentation

## 2011-10-20 NOTE — ED Notes (Signed)
PT c/o new onset vaginal discharge first noticed when period stopped. Pt c/o vaginal itching, no pain or dysuria.

## 2011-10-21 ENCOUNTER — Encounter (HOSPITAL_COMMUNITY): Payer: Self-pay | Admitting: *Deleted

## 2011-10-21 LAB — PREGNANCY, URINE: Preg Test, Ur: NEGATIVE

## 2011-10-21 LAB — WET PREP, GENITAL
Trich, Wet Prep: NONE SEEN
Yeast Wet Prep HPF POC: NONE SEEN

## 2011-10-21 NOTE — Discharge Instructions (Signed)
Your pelvic exam showed no signs of infection.  Your swab for gonorrhea and chlamydia is still running, and you will be contacted if they are positive.  It is common during the time two weeks after your period to have more vaginal discharge.  Please follow up with your gynecologist for worsening condition or new concerning symptoms.   Disorders of the Vulva It is important to know the outside (external) area of the female genitalia to properly examine yourself. The vulva or labia, sometimes also called the lips around the vagina, covers the pubic bone and surrounds the vagina. The larger or outer labia is called the labia majora. The inner or smaller labia is called the labia minora. The clitoris is at the top of the vulva. It is covered by a small area of tissue called the hood. Below the clitoris is the opening of the tube from the bladder, which is the urethral opening. Just below the urethral opening is the opening of the vagina. This area is called the vestibule. Inside the vestibule are bartholin and skene glands that lubricate the outside of the vagina during sexual intercourse. The perineum is the area that lies between the vagina and anus. You should examine your external genitalia once a month just as you do your self breast examination. SIGNS AND SYMPTOMS TO LOOK FOR DURING YOUR EXAMINATION:  Swelling.   Redness.   Bumps.   Blisters.   Black or white areas.   Itching.   Bleeding.   Burning.  TYPES OF VULVAR PROBLEMS  Infections.   Yeast (fungus) is the most common infection that causes redness, swelling, itching and a thick white vaginal discharge. Women with diabetes, taking medicines that kill germs (antibiotics) or on birth control pills are at risk for yeast infection. This infection is treated with vaginal creams, suppositories and anti-itch cream for the vulva.   Genital warts (condyloma) are caused by the human papilloma virus. They are raised bumps that can itch, bleed,  cause discomfort and sometimes appear in groups like cauliflower. This is a sexually transmitted disease (STD) caused by sexual contact. This can be treated with topical medication, freezing, electrocautery, laser or surgical removal.   Genital herpes is a virus that causes redness, swelling, blisters and ulcers that can cause itching and are very painful. This is also a STD. There are medications to control the symptoms of herpes, but there is no cure. Once you have it, you keep getting it because the virus stays in your body.   Contact dermatitis. Contact dermatitis is an irritation of the vulva that can cause redness, swelling and itching. It can be caused by:   Perfumed toilet paper.   Deodorants.   Talcum powder.   Hygiene sprays.   Tampons.   Soaps and fabric softeners.   Wearing wet underwear and bathing suits too long.   Spermicide.   Condoms.   Diaphragms.   Poison ivy.  Treatment will depend on eliminating the cause and treating the symptoms.  Vulvodynia.   Vulvodynia is "painful vulva." It includes burning, itching, irritation and a feeling of rawness or bruising of the vulva. Treating this problem depends on the cause and diagnosis. Treatment can take a long time.   Vulvar Dystrophy.   Vulvar Dystrophy is a disorder of the skin of the vulva. The skin can be too thick with hard raised patches or too thin skin showing wrinkles. Vulvar dystrophy can cause redness or white patches, itching and burning sensation. A biopsy may be  needed to diagnose the problem. Treatment with creams or ointments depends on the diagnosis.   Vulvar Cancer.   Vulvar cancer is usually a form of squamus skin cancer. Other types of vulvar cancer like melenoma (a dark or black, irregular shaped mole that can bleed easily) and adenocarcinoma (red, itchy, scaly area that looks like eczema) are rare. Treatment is usually surgery to remove the cancerous area, the vulva and the lymph nodes in the  groin. This can be done with or without radiation and chemotherapy.  HOME CARE INSTRUCTIONS   Look at your vulva and external genital area every month.   Follow and finish all treatment given to you by your caregiver.   Do not use perfumed or scented soaps, detergents, hygiene spray, talcum powder, tampons, douches or toilet paper.   Remove your tampon every 6 hours. Do not leave tampons in overnight.   Wear loose-fitting clothing.   Wear underwear that is cotton.   Avoid spermicidal creams or foams that irritate you.  SEEK MEDICAL CARE IF:   You notice changes on your vulva such as redness, swelling, itching, color changes, bleeding, small bumps, blisters or any discomfort.   You develop an abnormal vaginal discharge.   You have painful sexual intercourse.   You notice swelling of the lymph nodes in your groin.  Document Released: 10/11/2007 Document Revised: 04/13/2011 Document Reviewed: 07/25/2010 [facility] Patient Information 2012 [place], [place].

## 2011-10-21 NOTE — ED Notes (Signed)
Set up for pelvic exam. Pt verbalized understanding of procedure.

## 2011-10-25 NOTE — ED Provider Notes (Signed)
History     CSN: [id number]  Arrival date & time 10/20/11  2221   First MD Initiated Contact with Patient 10/21/11 0111      Chief Complaint  Patient presents with  . Vaginal Discharge    (Consider location/radiation/quality/duration/timing/severity/associated sxs/prior treatment) HPI 36 yo female presents to the ER c/o vaginal discharge with itching.  Pt reports sxs ongoing for the 10 days, worse since the end of her period.  LMP 2 weeks ago.  Discharge is describe as sometime thin and clear, sometimes white, sometimes thick and green.  She has had itching sometimes.  No new sexual partner, but does not always use condoms.  No new soaps or detergents.  No douching.  No abd pain, dysuria.   Past Medical History  Diagnosis Date  . Pseudotumor cerebri     Past Surgical History  Procedure Date  . Cesarean section     No family history on file.  History  Substance Use Topics  . Smoking status: Current Some Day Smoker    Types: Cigarettes  . Smokeless tobacco: Not on file  . Alcohol Use: Yes     occasional    OB History    Grav Para Term Preterm Abortions TAB SAB Ect Mult Living                  Review of Systems  All other systems reviewed and are negative.    Allergies  Review of patient's allergies indicates no known allergies.  Home Medications  No current outpatient prescriptions on file.  BP 103/70  Pulse 66  Temp 97.6 F (36.4 C) (Oral)  Resp 20  SpO2 100%  LMP 10/13/2011  Physical Exam  Nursing note and vitals reviewed. Constitutional: She appears well-developed and well-nourished. No distress.  Abdominal: Soft. Bowel sounds are normal. She exhibits no distension and no mass. There is no tenderness. There is no rebound and no guarding.  Genitourinary: Vagina normal and uterus normal. No vaginal discharge found.       Normal external genitalia.  No CMT, no adnexal or uterine tenderness or masses.  Normal physiologic d/c noted  Skin: Skin is warm  and dry. No rash noted. She is not diaphoretic. No erythema. No pallor.    ED Course  Procedures (including critical care time)  Labs Reviewed  WET PREP, GENITAL - Abnormal; Notable for the following:    WBC, Wet Prep HPF POC FEW (*)     All other components within normal limits  PREGNANCY, URINE  GC/CHLAMYDIA PROBE AMP, GENITAL  LAB REPORT - SCANNED   No results found.   1. Vaginitis       MDM  Pt with normal exam, no signs of BV, candidiasis, and do not feel pt has STD.  Pt advised to do sitz baths and genital washing with water only.          Olivia Mackie, MD 10/25/11 [phone]

## 2011-11-13 ENCOUNTER — Encounter: Payer: Self-pay | Admitting: Obstetrics & Gynecology

## 2012-04-02 ENCOUNTER — Inpatient Hospital Stay (HOSPITAL_COMMUNITY)
Admission: AD | Admit: 2012-04-02 | Discharge: 2012-04-02 | Disposition: A | Discharge status: Home / Self Care | Payer: Self-pay | Source: Ambulatory Visit | Attending: Obstetrics & Gynecology | Admitting: Obstetrics & Gynecology

## 2012-04-02 ENCOUNTER — Encounter (HOSPITAL_COMMUNITY): Payer: Self-pay | Admitting: *Deleted

## 2012-04-02 DIAGNOSIS — N760 Acute vaginitis: Secondary | ICD-10-CM | POA: Insufficient documentation

## 2012-04-02 DIAGNOSIS — B9689 Other specified bacterial agents as the cause of diseases classified elsewhere: Secondary | ICD-10-CM | POA: Insufficient documentation

## 2012-04-02 DIAGNOSIS — A499 Bacterial infection, unspecified: Secondary | ICD-10-CM

## 2012-04-02 DIAGNOSIS — N949 Unspecified condition associated with female genital organs and menstrual cycle: Secondary | ICD-10-CM | POA: Insufficient documentation

## 2012-04-02 LAB — URINALYSIS, ROUTINE W REFLEX MICROSCOPIC
Glucose, UA: NEGATIVE mg/dL
Specific Gravity, Urine: >1.03 — ABNORMAL HIGH (ref 1.005–1.030)

## 2012-04-02 LAB — WET PREP, GENITAL
Trich, Wet Prep: NONE SEEN
Yeast Wet Prep HPF POC: NONE SEEN

## 2012-04-02 LAB — URINE MICROSCOPIC-ADD ON

## 2012-04-02 MED ORDER — METRONIDAZOLE 500 MG PO TABS
2000.0000 mg | ORAL_TABLET | Freq: Once | ORAL | Status: AC
  Administered 2012-04-02: 2000 mg via ORAL
  Filled 2012-04-02: qty 4

## 2012-04-02 NOTE — MAU Provider Note (Signed)
History     CSN: [id number]  Arrival date and time: 04/02/12 0146   None     Chief Complaint  Patient presents with  . Vaginal Discharge   HPI Beverly Martinez is a 36 y.o. female who presents to MAU with vaginal discharge. The discharge started over a week ago, two days before her period. After period stopped discharge got worse with really bad odor. She describes the discharge as watery yellow. Associated symptoms include vaginal irritation and itching. She denies abdominal or pelvic pain. Last pap smear was a year ago and normal. She has been with her current sex partner off and on for 3 years. The history was provided by the patient.  OB History    Grav Para Term Preterm Abortions TAB SAB Ect Mult Living   5 2  2 3 2 1   2       Past Medical History  Diagnosis Date  . Pseudotumor cerebri     Past Surgical History  Procedure Date  . Cesarean section     History reviewed. No pertinent family history.  History  Substance Use Topics  . Smoking status: Current Some Day Smoker    Types: Cigarettes  . Smokeless tobacco: Not on file  . Alcohol Use: Yes     Comment: occasional    Allergies: No Known Allergies  Prescriptions prior to admission  Medication Sig Dispense Refill  . acetaZOLAMIDE (DIAMOX) 250 MG tablet Take 250 mg by mouth 2 (two) times daily.        Review of Systems  Constitutional: Negative for fever and chills.  Eyes: Negative for blurred vision and double vision.  Respiratory: Negative for cough and wheezing.   Cardiovascular: Negative for chest pain and palpitations.  Gastrointestinal: Negative for nausea, vomiting and abdominal pain.  Genitourinary: Negative for urgency and frequency.       Vaginal discharge, vaginal itching  Musculoskeletal: Negative for back pain.  Neurological: Negative for dizziness, seizures and headaches.  Psychiatric/Behavioral: Negative for depression. The patient is not nervous/anxious.    Physical Exam   Blood  pressure 116/66, pulse 72, temperature 97.6 F (36.4 C), temperature source Oral, resp. rate 18, height 5' 3.6" (1.615 m), weight 233 lb (105.688 kg), last menstrual period 03/25/2012.  Physical Exam  Nursing note and vitals reviewed. Constitutional: She is oriented to person, place, and time. No distress.       Obese A/A female  HENT:  Head: Normocephalic and atraumatic.  Eyes: EOM are normal.  Neck: Neck supple.  Cardiovascular: Normal rate.   Respiratory: Effort normal.  GI: Soft. There is no tenderness.  Genitourinary:       External genitalia without lesions. Frothy malodorous, yellow discharge vaginal vault. No CMT, no adnexal tenderness. Unable to palpate uterus due to patient habitus.  Musculoskeletal: Normal range of motion.  Neurological: She is alert and oriented to person, place, and time.  Skin: Skin is warm and dry.  Psychiatric: She has a normal mood and affect. Her behavior is normal. Judgment and thought content normal.   Results for orders placed during the hospital encounter of 04/02/12 (from the past 24 hour(s))  URINALYSIS, ROUTINE W REFLEX MICROSCOPIC     Status: Abnormal   Collection Time   04/02/12  1:55 AM      Component Value Range   Color, Urine YELLOW  YELLOW   APPearance CLEAR  CLEAR   Specific Gravity, Urine >1.030 (*) 1.005 - 1.030   pH 6.0  5.0 -  8.0   Glucose, UA NEGATIVE  NEGATIVE mg/dL   Hgb urine dipstick TRACE (*) NEGATIVE   Bilirubin Urine NEGATIVE  NEGATIVE   Ketones, ur NEGATIVE  NEGATIVE mg/dL   Protein, ur NEGATIVE  NEGATIVE mg/dL   Urobilinogen, UA 0.2  0.0 - 1.0 mg/dL   Nitrite NEGATIVE  NEGATIVE   Leukocytes, UA NEGATIVE  NEGATIVE  URINE MICROSCOPIC-ADD ON     Status: Normal   Collection Time   04/02/12  1:55 AM      Component Value Range   Squamous Epithelial / LPF RARE  RARE   WBC, UA 0-2  <3 WBC/hpf   RBC / HPF 0-2  <3 RBC/hpf  WET PREP, GENITAL     Status: Abnormal   Collection Time   04/02/12  2:20 AM      Component  Value Range   Yeast Wet Prep HPF POC NONE SEEN  NONE SEEN   Trich, Wet Prep NONE SEEN  NONE SEEN   Clue Cells Wet Prep HPF POC MODERATE (*) NONE SEEN   WBC, Wet Prep HPF POC FEW (*) NONE SEEN   Assessment: 36 y.o. female with vaginal discharge   Bacterial vaginosis  Plan:  Flagyl 2 grams PO now (patient has no money to get Rx)   Follow up with [facility]   GC, Chlamydia cultures pending Discussed with the patient and all questioned fully answered. She will return if any problems arise.   Medication List     As of 04/02/2012 11:10 PM    CONTINUE taking these medications         acetaZOLAMIDE 250 MG tablet   Commonly known as: DIAMOX          MAU Course  Procedures Wadie Liew, RN, FNP, BC 04/02/2012, 2:17 AM

## 2012-04-02 NOTE — MAU Note (Signed)
Pt LMP 11/18, having yellow vag discharge with an odor.

## 2012-04-03 LAB — GC/CHLAMYDIA PROBE AMP, GENITAL
Chlamydia, DNA Probe: NEGATIVE
GC Probe Amp, Genital: NEGATIVE

## 2012-04-03 NOTE — MAU Provider Note (Signed)
Attestation of Attending Supervision of Advanced Practitioner (CNM/NP): Evaluation and management procedures were performed by the Advanced Practitioner under my supervision and collaboration.  I have reviewed the Advanced Practitioner's note and chart, and I agree with the management and plan.  UGONNA  ANYANWU, MD, FACOG Attending Obstetrician & Gynecologist Faculty Practice, [facility]  

## 2012-08-06 ENCOUNTER — Encounter (HOSPITAL_COMMUNITY): Payer: Self-pay | Admitting: *Deleted

## 2012-08-06 ENCOUNTER — Inpatient Hospital Stay (HOSPITAL_COMMUNITY)
Admission: AD | Admit: 2012-08-06 | Discharge: 2012-08-06 | Disposition: A | Discharge status: Home / Self Care | Payer: Self-pay | Source: Ambulatory Visit | Attending: Obstetrics & Gynecology | Admitting: Obstetrics & Gynecology

## 2012-08-06 DIAGNOSIS — L293 Anogenital pruritus, unspecified: Secondary | ICD-10-CM | POA: Insufficient documentation

## 2012-08-06 DIAGNOSIS — N949 Unspecified condition associated with female genital organs and menstrual cycle: Secondary | ICD-10-CM | POA: Insufficient documentation

## 2012-08-06 DIAGNOSIS — L739 Follicular disorder, unspecified: Secondary | ICD-10-CM

## 2012-08-06 DIAGNOSIS — N898 Other specified noninflammatory disorders of vagina: Secondary | ICD-10-CM

## 2012-08-06 DIAGNOSIS — L738 Other specified follicular disorders: Secondary | ICD-10-CM | POA: Insufficient documentation

## 2012-08-06 LAB — WET PREP, GENITAL: Trich, Wet Prep: NONE SEEN

## 2012-08-06 LAB — URINALYSIS, ROUTINE W REFLEX MICROSCOPIC
Bilirubin Urine: NEGATIVE
Hgb urine dipstick: NEGATIVE
Ketones, ur: NEGATIVE mg/dL
Nitrite: NEGATIVE
pH: 6.5 (ref 5.0–8.0)

## 2012-08-06 LAB — GC/CHLAMYDIA PROBE AMP
CT Probe RNA: NEGATIVE
GC Probe RNA: NEGATIVE

## 2012-08-06 NOTE — MAU Provider Note (Signed)
History     CSN: [id number]  Arrival date and time: 08/06/12 0022   None     No chief complaint on file.  HPI Beverly Martinez is a 37 y.o. female who presents to MAU for STD testing. She states she has vaginal discharge and itching. She has had unprotected sex. She also has an area on her right buttock that she wants checked. She denies fever, chills, nausea, vomiting or other problems.  OB History   Grav Para Term Preterm Abortions TAB SAB Ect Mult Living   5 2  2 3 2 1   2       Past Medical History  Diagnosis Date  . Pseudotumor cerebri     Past Surgical History  Procedure Laterality Date  . Cesarean section      No family history on file.  History  Substance Use Topics  . Smoking status: Current Some Day Smoker    Types: Cigarettes  . Smokeless tobacco: Not on file  . Alcohol Use: Yes     Comment: occasional    Allergies: No Known Allergies  Prescriptions prior to admission  Medication Sig Dispense Refill  . acetaZOLAMIDE (DIAMOX) 250 MG tablet Take 250 mg by mouth 2 (two) times daily.        Review of Systems  Constitutional: Negative for fever and chills.  Eyes: Negative for blurred vision.  Respiratory: Negative for cough.   Cardiovascular: Negative for chest pain.  Gastrointestinal: Negative for nausea, vomiting and abdominal pain.  Genitourinary: Negative for dysuria and frequency.       Vaginal discharge  Musculoskeletal: Negative for back pain.  Skin: Positive for rash.  Neurological: Negative for dizziness and headaches.  Psychiatric/Behavioral: Negative for depression.   Blood pressure 127/76, pulse 79, temperature 98.9 F (37.2 C), temperature source Oral, resp. rate 20, height 5\' 3"  (1.6 m), weight 229 lb 8 oz (104.101 kg), last menstrual period 07/31/2012.  Physical Exam  Constitutional: She is oriented to person, place, and time. No distress.  Obese A/A female  HENT:  Head: Normocephalic and atraumatic.  Eyes: EOM are normal.  Neck:  Neck supple.  Cardiovascular: Normal rate.   Respiratory: Effort normal.  GI: Soft. There is no tenderness.  Genitourinary:  External genitalia with small raised area right buttock c/w folliculitis. No vaginal lesions noted. White discharge vaginal vault. No CMT, no adnexal tenderness, uterus without palpable enlargement.  Musculoskeletal: Normal range of motion.  Neurological: She is alert and oriented to person, place, and time.  Skin: Skin is warm and dry.  Psychiatric: She has a normal mood and affect. Her behavior is normal. Judgment and thought content normal.   Results for orders placed during the hospital encounter of 08/06/12 (from the past 24 hour(s))  URINALYSIS, ROUTINE W REFLEX MICROSCOPIC     Status: None   Collection Time    08/06/12 12:38 AM      Result Value Range   Color, Urine YELLOW  YELLOW   APPearance CLEAR  CLEAR   Specific Gravity, Urine 1.020  1.005 - 1.030   pH 6.5  5.0 - 8.0   Glucose, UA NEGATIVE  NEGATIVE mg/dL   Hgb urine dipstick NEGATIVE  NEGATIVE   Bilirubin Urine NEGATIVE  NEGATIVE   Ketones, ur NEGATIVE  NEGATIVE mg/dL   Protein, ur NEGATIVE  NEGATIVE mg/dL   Urobilinogen, UA 0.2  0.0 - 1.0 mg/dL   Nitrite NEGATIVE  NEGATIVE   Leukocytes, UA NEGATIVE  NEGATIVE  POCT PREGNANCY, URINE  Status: None   Collection Time    08/06/12 12:48 AM      Result Value Range   Preg Test, Ur NEGATIVE  NEGATIVE  WET PREP, GENITAL     Status: Abnormal   Collection Time    08/06/12  1:00 AM      Result Value Range   Yeast Wet Prep HPF POC NONE SEEN  NONE SEEN   Trich, Wet Prep NONE SEEN  NONE SEEN   Clue Cells Wet Prep HPF POC FEW (*) NONE SEEN   WBC, Wet Prep HPF POC FEW (*) NONE SEEN    Assessment: 37 y.o. female with vaginal discharge   Folliculitis  Plan:  Neosporin ointment   Follow up with Health department. Procedures  Lorian Yaun, RN, FNP, [facility] 08/06/2012, 1:03 AM

## 2012-08-06 NOTE — MAU Note (Signed)
PT  SAYS SHE HAS ITCHING  X2 WEEKS ,  THINKS  SHE HAS A BOIL X4 DAYS   .  SAT IN WARM WATER TODAY.     SAYS HAS VAG D/C - WITH ODOR.    USES CONDOMS SOMETIMES.

## 2012-12-07 ENCOUNTER — Emergency Department (HOSPITAL_COMMUNITY)
Admission: EM | Admit: 2012-12-07 | Discharge: 2012-12-07 | Disposition: A | Discharge status: Home / Self Care | Payer: Self-pay | Attending: Emergency Medicine | Admitting: Emergency Medicine

## 2012-12-07 ENCOUNTER — Encounter (HOSPITAL_COMMUNITY): Payer: Self-pay | Admitting: Emergency Medicine

## 2012-12-07 DIAGNOSIS — F17200 Nicotine dependence, unspecified, uncomplicated: Secondary | ICD-10-CM | POA: Insufficient documentation

## 2012-12-07 DIAGNOSIS — R51 Headache: Secondary | ICD-10-CM | POA: Insufficient documentation

## 2012-12-07 DIAGNOSIS — G932 Benign intracranial hypertension: Secondary | ICD-10-CM | POA: Insufficient documentation

## 2012-12-07 DIAGNOSIS — R519 Headache, unspecified: Secondary | ICD-10-CM

## 2012-12-07 DIAGNOSIS — Z760 Encounter for issue of repeat prescription: Secondary | ICD-10-CM | POA: Insufficient documentation

## 2012-12-07 DIAGNOSIS — Z8679 Personal history of other diseases of the circulatory system: Secondary | ICD-10-CM | POA: Insufficient documentation

## 2012-12-07 MED ORDER — ACETAZOLAMIDE 125 MG PO TABS: 250.0000 mg | ORAL_TABLET | Freq: Three times a day (TID) | ORAL | Status: DC

## 2012-12-07 MED ORDER — ASPIRIN 81 MG PO CHEW
324.0000 mg | CHEWABLE_TABLET | Freq: Once | ORAL | Status: AC
  Administered 2012-12-07: 324 mg via ORAL
  Filled 2012-12-07: qty 4

## 2012-12-07 NOTE — ED Notes (Signed)
Pt c/o R sided onset 2 days ago, denies phonophobia, photophobia. Denies n/v. Pt states she does have increased intracranial pressure but can not afford diamox. A & O.

## 2012-12-07 NOTE — ED Notes (Signed)
Pt denies nausea or vomiting

## 2012-12-07 NOTE — ED Provider Notes (Signed)
Medical screening examination/treatment/procedure(s) were performed by non-physician practitioner and as supervising physician I was immediately available for consultation/collaboration.   Dagmar Hait, MD 12/07/12 2118

## 2012-12-07 NOTE — ED Provider Notes (Signed)
CSN: [id number]     Arrival date & time 12/07/12  0524 History     First MD Initiated Contact with Patient 12/07/12 [phone]     Chief Complaint  Patient presents with  . Headache   (Consider location/radiation/quality/duration/timing/severity/associated sxs/prior Treatment) HPI Comments: Patient with history of pseudotumor cerebri, presents to the ED with a CC of headache x 2 days.  The patient states that the symptoms have been progressively worsening.  She endorses associated pulsatile tinnitus.  She denies visual complaints.  She states that nothing makes her symptoms better or worse.  She states that she had been followed by 2 different neurology groups, but that she was fired.  She states that she has not been taking her diamox, because she has run out.  She is requesting a refill today.  She denies any weakness, numbness, or tingling in her extremities.  Denies any other complaints at this time.  The history is provided by the patient. No language interpreter was used.    Past Medical History  Diagnosis Date  . Pseudotumor cerebri    Past Surgical History  Procedure Laterality Date  . Cesarean section     No family history on file. History  Substance Use Topics  . Smoking status: Current Some Day Smoker    Types: Cigarettes  . Smokeless tobacco: Not on file  . Alcohol Use: Yes     Comment: occasional   OB History   Grav Para Term Preterm Abortions TAB SAB Ect Mult Living   5 2  2 3 2 1   2      Review of Systems  All other systems reviewed and are negative.    Allergies  Review of patient's allergies indicates no known allergies.  Home Medications   Current Outpatient Rx  Name  Route  Sig  Dispense  Refill  . Aspirin-Caffeine 845-65 MG PACK   Oral   Take 1 Package by mouth 2 (two) times daily as needed (headache).          BP 102/56  Pulse 75  Temp(Src) 98.9 F (37.2 C) (Oral)  Resp 18  Ht 5' 3.5" (1.613 m)  Wt 234 lb 4 oz (106.255 kg)  BMI 40.84 kg/m2   SpO2 100%  LMP 11/27/2012 Physical Exam  Nursing note and vitals reviewed. Constitutional: She is oriented to person, place, and time. She appears well-developed and well-nourished.  HENT:  Head: Normocephalic and atraumatic.  Right Ear: External ear normal.  Left Ear: External ear normal.  Non-tender over temporal artery, no increased pain with chewing.  Eyes: Conjunctivae and EOM are normal. Pupils are equal, round, and reactive to light. Right conjunctiva is not injected. Right conjunctiva has no hemorrhage. Left conjunctiva is not injected. Left conjunctiva has no hemorrhage. Right eye exhibits normal extraocular motion and no nystagmus. Left eye exhibits normal extraocular motion and no nystagmus.  Fundoscopic exam:      The right eye shows no arteriolar narrowing and no AV nicking.       The left eye shows no arteriolar narrowing and no AV nicking.  No obvious papilledema  Neck: Normal range of motion. Neck supple.  No pain with neck flexion, no meningismus  Cardiovascular: Normal rate, regular rhythm and normal heart sounds.  Exam reveals no gallop and no friction rub.   No murmur heard. Pulmonary/Chest: Effort normal and breath sounds normal. No respiratory distress. She has no wheezes. She has no rales. She exhibits no tenderness.  Abdominal: Soft. Bowel sounds  are normal. She exhibits no distension and no mass. There is no tenderness. There is no rebound and no guarding.  Musculoskeletal: Normal range of motion. She exhibits no edema and no tenderness.  Normal gait.  Neurological: She is alert and oriented to person, place, and time. She has normal reflexes.  CN 3-12 intact, no pronator drift, normal shin to heel, normal RAM, sensation and strength intact bilaterally.  Skin: Skin is warm and dry.  Psychiatric: She has a normal mood and affect. Her behavior is normal. Judgment and thought content normal.    ED Course   Procedures (including critical care time)  Labs  Reviewed - No data to display No results found. 1. Headache     MDM  Patient with pseudotumor cerebri.  No changes from baseline symptoms.  Neurovascularly intact.  Patient requests a refill of her acetazolamide.  Offered LP, but patient states that she needs to go to work.  I told her that if she changes her mind, she can always come back.  I discussed this with Dr. Gwendolyn Grant, who agrees with the plan.  Roxy Horseman, PA-C 12/07/12 [phone]

## 2013-05-12 ENCOUNTER — Encounter (HOSPITAL_COMMUNITY): Payer: Self-pay | Admitting: *Deleted

## 2013-05-12 ENCOUNTER — Inpatient Hospital Stay (HOSPITAL_COMMUNITY)
Admission: AD | Admit: 2013-05-12 | Discharge: 2013-05-12 | Disposition: A | Discharge status: Home / Self Care | Payer: Self-pay | Source: Ambulatory Visit | Attending: Obstetrics & Gynecology | Admitting: Obstetrics & Gynecology

## 2013-05-12 DIAGNOSIS — N760 Acute vaginitis: Secondary | ICD-10-CM

## 2013-05-12 DIAGNOSIS — O99330 Smoking (tobacco) complicating pregnancy, unspecified trimester: Secondary | ICD-10-CM | POA: Insufficient documentation

## 2013-05-12 DIAGNOSIS — Z331 Pregnant state, incidental: Secondary | ICD-10-CM

## 2013-05-12 DIAGNOSIS — Z3201 Encounter for pregnancy test, result positive: Secondary | ICD-10-CM

## 2013-05-12 DIAGNOSIS — B9689 Other specified bacterial agents as the cause of diseases classified elsewhere: Secondary | ICD-10-CM | POA: Insufficient documentation

## 2013-05-12 DIAGNOSIS — O2390 Unspecified genitourinary tract infection in pregnancy, unspecified trimester: Secondary | ICD-10-CM | POA: Insufficient documentation

## 2013-05-12 DIAGNOSIS — A499 Bacterial infection, unspecified: Secondary | ICD-10-CM | POA: Insufficient documentation

## 2013-05-12 HISTORY — DX: Unspecified infectious disease: B99.9

## 2013-05-12 HISTORY — DX: Unspecified abnormal cytological findings in specimens from cervix uteri: R87.619

## 2013-05-12 HISTORY — DX: Reserved for concepts with insufficient information to code with codable children: IMO0002

## 2013-05-12 LAB — POCT PREGNANCY, URINE: PREG TEST UR: POSITIVE — AB

## 2013-05-12 LAB — URINALYSIS, ROUTINE W REFLEX MICROSCOPIC
Bilirubin Urine: NEGATIVE
GLUCOSE, UA: NEGATIVE mg/dL
Hgb urine dipstick: NEGATIVE
Ketones, ur: NEGATIVE mg/dL
LEUKOCYTES UA: NEGATIVE
Nitrite: NEGATIVE
PH: 6 (ref 5.0–8.0)
Protein, ur: NEGATIVE mg/dL
Specific Gravity, Urine: 1.025 (ref 1.005–1.030)
Urobilinogen, UA: 0.2 mg/dL (ref 0.0–1.0)

## 2013-05-12 LAB — WET PREP, GENITAL
TRICH WET PREP: NONE SEEN
Yeast Wet Prep HPF POC: NONE SEEN

## 2013-05-12 MED ORDER — PRENATAL VITAMINS 28-0.8 MG PO TABS: 1.0000 | ORAL_TABLET | Freq: Every day | ORAL | Status: DC

## 2013-05-12 MED ORDER — ONDANSETRON 4 MG PO TBDP: 4.0000 mg | ORAL_TABLET | Freq: Three times a day (TID) | ORAL | Status: DC | PRN

## 2013-05-12 MED ORDER — METRONIDAZOLE 500 MG PO TABS: 500.0000 mg | ORAL_TABLET | Freq: Two times a day (BID) | ORAL | Status: DC

## 2013-05-12 MED ORDER — PROMETHAZINE HCL 25 MG PO TABS: 25.0000 mg | ORAL_TABLET | Freq: Four times a day (QID) | ORAL | Status: DC | PRN

## 2013-05-12 NOTE — MAU Provider Note (Signed)
History     CSN: [id number]  Arrival date and time: 05/12/13 1015   First Provider Initiated Contact with Patient 05/12/13 1053      Chief Complaint  Patient presents with  . Vaginal Discharge  . Possible Pregnancy   HPI  Ms. Beverly Martinez is a 38 y.o. female [phone] at [redacted]w[redacted]d who presents for suspicion for pregnancy and vaginal discharge. The vaginal discharge started 3 weeks ago; it comes and goes. The discharge has an odor; yellow in color. Pt has noticed some abdominal cramping however denies pain at this time, rates her pain 0/10. She denies vaginal bleeding. Pt is very upset about the possibility of being pregnant and would like the discharge to be tested and would like to go home.  She is very worried that she will experience nausea and vomiting and that will prevent her from finishing school.   OB History   Grav Para Term Preterm Abortions TAB SAB Ect Mult Living   6 2  2 3 2 1   2       Past Medical History  Diagnosis Date  . Pseudotumor cerebri   . Pregnancy induced hypertension   . Infection     UTI  . Abnormal Pap smear     Past Surgical History  Procedure Laterality Date  . Cesarean section    . Colposcopy    . Induced abortion      Family History  Problem Relation Age of Onset  . Hypertension Mother   . Cancer Maternal Grandmother     pancreatic    History  Substance Use Topics  . Smoking status: Current Some Day Smoker    Types: Cigarettes  . Smokeless tobacco: Current User  . Alcohol Use: Yes     Comment: occasional    Allergies: No Known Allergies  Prescriptions prior to admission  Medication Sig Dispense Refill  . acetaZOLAMIDE (DIAMOX) 125 MG tablet Take 2 tablets (250 mg total) by mouth 3 (three) times daily.  90 tablet  1  . Aspirin-Caffeine 845-65 MG PACK Take 1 Package by mouth 2 (two) times daily as needed (headache).       Results for orders placed during the hospital encounter of 05/12/13 (from the past 72 hour(s))  POCT  PREGNANCY, URINE     Status: Abnormal   Collection Time    05/12/13 10:49 AM      Result Value Range   Preg Test, Ur POSITIVE (*) NEGATIVE   Comment:            THE SENSITIVITY OF THIS     METHODOLOGY IS >24 mIU/mL   Results for orders placed during the hospital encounter of 05/12/13 (from the past 48 hour(s))  URINALYSIS, ROUTINE W REFLEX MICROSCOPIC     Status: None   Collection Time    05/12/13 10:49 AM      Result Value Range   Color, Urine YELLOW  YELLOW   APPearance CLEAR  CLEAR   Specific Gravity, Urine 1.025  1.005 - 1.030   pH 6.0  5.0 - 8.0   Glucose, UA NEGATIVE  NEGATIVE mg/dL   Hgb urine dipstick NEGATIVE  NEGATIVE   Bilirubin Urine NEGATIVE  NEGATIVE   Ketones, ur NEGATIVE  NEGATIVE mg/dL   Protein, ur NEGATIVE  NEGATIVE mg/dL   Urobilinogen, UA 0.2  0.0 - 1.0 mg/dL   Nitrite NEGATIVE  NEGATIVE   Leukocytes, UA NEGATIVE  NEGATIVE   Comment: MICROSCOPIC NOT DONE ON URINES WITH NEGATIVE PROTEIN,  BLOOD, LEUKOCYTES, NITRITE, OR GLUCOSE <1000 mg/dL.  POCT PREGNANCY, URINE     Status: Abnormal   Collection Time    05/12/13 10:49 AM      Result Value Range   Preg Test, Ur POSITIVE (*) NEGATIVE   Comment:            THE SENSITIVITY OF THIS     METHODOLOGY IS >24 mIU/mL  WET PREP, GENITAL     Status: Abnormal   Collection Time    05/12/13 11:08 AM      Result Value Range   Yeast Wet Prep HPF POC NONE SEEN  NONE SEEN   Trich, Wet Prep NONE SEEN  NONE SEEN   Clue Cells Wet Prep HPF POC MODERATE (*) NONE SEEN   WBC, Wet Prep HPF POC FEW (*) NONE SEEN   Comment: MANY BACTERIA SEEN    Review of Systems  Gastrointestinal: Negative for nausea, vomiting, abdominal pain, diarrhea and constipation.  Genitourinary: Negative for dysuria, urgency, frequency and hematuria.       + vaginal discharge. No vaginal bleeding. No dysuria.    Physical Exam   Blood pressure 116/71, pulse 82, temperature 98.8 F (37.1 C), temperature source Oral, resp. rate 18, height 5\' 3"  (1.6  m), weight 109.952 kg (242 lb 6.4 oz), last menstrual period 04/10/2013, SpO2 100.00%.  Physical Exam  Constitutional: She is oriented to person, place, and time. She appears well-developed and well-nourished. No distress.  HENT:  Head: Normocephalic.  Eyes: Pupils are equal, round, and reactive to light.  Neck: Neck supple.  Respiratory: Effort normal.  GI: Soft. She exhibits no distension. There is no tenderness. There is no rebound and no guarding.  Genitourinary: Vaginal discharge found.  Speculum exam: Vagina - Small amount of creamy, pale yellow discharge, no odor Cervix - No contact bleeding, no active bleeding  Bimanual exam: Cervix closed, no CMT  Uterus non tender, normal size Adnexa non tender, no masses bilaterally GC/Chlam, wet prep done Chaperone present for exam.   Musculoskeletal: Normal range of motion.  Neurological: She is alert and oriented to person, place, and time.  Skin: Skin is warm. She is not diaphoretic.  Psychiatric: Her behavior is normal.    MAU Course  Procedures None  MDM UA UPT- positive Pt requests zofran and phenergan prescriptions; pt is concerned that nausea and vomiting will interfere with her schooling.  Wet prep GC/Chlamydia   Assessment and Plan   A:  1. Pregnancy as incidental finding   2. BV (bacterial vaginosis)     P: Discharge home in stable condition RX: Prenatal vitamin        Flagyl         Phenergan         Zofran  Return to MAU as needed, if symptoms worsen  Start prenatal care as soon as possible   Ronnald Ramp, NP  05/12/2013, 10:53 AM

## 2013-05-12 NOTE — MAU Note (Addendum)
First noted d/c like 3 wks ago, comes and goes. Cramping feeling like period was going to start.  Had pinkish d/c over weekend- end of Dec.  No vaginal discomfort, just discharge

## 2013-05-12 NOTE — MAU Note (Signed)
Patient states she has had a vaginal discharge for about 3 weeks. Thinks she might be pregnant. Has slight cramping.

## 2013-05-12 NOTE — Discharge Instructions (Signed)
ABCs of Pregnancy  A  Antepartum care is very important. Be sure you see your doctor and get prenatal care as soon as you think you are pregnant. At this time, you will be tested for infection, genetic abnormalities and potential problems with you and the pregnancy. This is the time to discuss diet, exercise, work, medications, labor, pain medication during labor and the possibility of a cesarean delivery. Ask any questions that may concern you. It is important to see your doctor regularly throughout your pregnancy. Avoid exposure to toxic substances and chemicals - such as cleaning solvents, lead and mercury, some insecticides, and paint. Pregnant women should avoid exposure to paint fumes, and fumes that cause you to feel ill, dizzy or faint. When possible, it is a good idea to have a pre-pregnancy consultation with your caregiver to begin some important recommendations your caregiver suggests such as, taking folic acid, exercising, quitting smoking, avoiding alcoholic beverages, etc.  B  Breastfeeding is the healthiest choice for both you and your baby. It has many nutritional benefits for the baby and health benefits for the mother. It also creates a very tight and loving bond between the baby and mother. Talk to your doctor, your family and friends, and your employer about how you choose to feed your baby and how they can support you in your decision. Not all birth defects can be prevented, but a woman can take actions that may increase her chance of having a healthy baby. Many birth defects happen very early in pregnancy, sometimes before a woman even knows she is pregnant. Birth defects or abnormalities of any child in your or the father's family should be discussed with your caregiver. Get a good support bra as your breast size changes. Wear it especially when you exercise and when nursing.   C  Celebrate the news of your pregnancy with the your spouse/father and family. Childbirth classes are helpful to  take for you and the spouse/father because it helps to understand what happens during the pregnancy, labor and delivery. Cesarean delivery should be discussed with your doctor so you are prepared for that possibility. The pros and cons of circumcision if it is a boy, should be discussed with your pediatrician. Cigarette smoking during pregnancy can result in low birth weight babies. It has been associated with infertility, miscarriages, tubal pregnancies, infant death (mortality) and poor health (morbidity) in childhood. Additionally, cigarette smoking may cause long-term learning disabilities. If you smoke, you should try to quit before getting pregnant and not smoke during the pregnancy. Secondary smoke may also harm a mother and her developing baby. It is a good idea to ask people to stop smoking around you during your pregnancy and after the baby is born. Extra calcium is necessary when you are pregnant and is found in your prenatal vitamin, in dairy products, green leafy vegetables and in calcium supplements.  D  A healthy diet according to your current weight and height, along with vitamins and mineral supplements should be discussed with your caregiver. Domestic abuse or violence should be made known to your doctor right away to get the situation corrected. Drink more water when you exercise to keep hydrated. Discomfort of your back and legs usually develops and progresses from the middle of the second trimester through to delivery of the baby. This is because of the enlarging baby and uterus, which may also affect your balance. Do not take illegal drugs. Illegal drugs can seriously harm the baby and you. Drink extra   fluids (water is best) throughout pregnancy to help your body keep up with the increases in your blood volume. Drink at least 6 to 8 glasses of water, fruit juice, or milk each day. A good way to know you are drinking enough fluid is when your urine looks almost like clear water or is very light  yellow.   E  Eat healthy to get the nutrients you and your unborn baby need. Your meals should include the five basic food groups. Exercise (30 minutes of light to moderate exercise a day) is important and encouraged during pregnancy, if there are no medical problems or problems with the pregnancy. Exercise that causes discomfort or dizziness should be stopped and reported to your caregiver. Emotions during pregnancy can change from being ecstatic to depression and should be understood by you, your partner and your family.  F  Fetal screening with ultrasound, amniocentesis and monitoring during pregnancy and labor is common and sometimes necessary. Take 400 micrograms of folic acid daily both before, when possible, and during the first few months of pregnancy to reduce the risk of birth defects of the brain and spine. All women who could possibly become pregnant should take a vitamin with folic acid, every day. It is also important to eat a healthy diet with fortified foods (enriched grain products, including cereals, rice, breads, and pastas) and foods with natural sources of folate (orange juice, green leafy vegetables, beans, peanuts, broccoli, asparagus, peas, and lentils). The father should be involved with all aspects of the pregnancy including, the prenatal care, childbirth classes, labor, delivery, and postpartum time. Fathers may also have emotional concerns about being a father, financial needs, and raising a family.  G  Genetic testing should be done appropriately. It is important to know your family and the father's history. If there have been problems with pregnancies or birth defects in your family, report these to your doctor. Also, genetic counselors can talk with you about the information you might need in making decisions about having a family. You can call a major medical center in your area for help in finding a board-certified genetic counselor. Genetic testing and counseling should be done  before pregnancy when possible, especially if there is a history of problems in the mother's or father's family. Certain ethnic backgrounds are more at risk for genetic defects.  H  Get familiar with the hospital where you will be having your baby. Get to know how long it takes to get there, the labor and delivery area, and the hospital procedures. Be sure your medical insurance is accepted there. Get your home ready for the baby including, clothes, the baby's room (when possible), furniture and car seat. Hand washing is important throughout the day, especially after handling raw meat and poultry, changing the baby's diaper or using the bathroom. This can help prevent the spread of many bacteria and viruses that cause infection. Your hair may become dry and thinner, but will return to normal a few weeks after the baby is born. Heartburn is a common problem that can be treated by taking antacids recommended by your caregiver, eating smaller meals 5 or 6 times a day, not drinking liquids when eating, drinking between meals and raising the head of your bed 2 to 3 inches.  I  Insurance to cover you, the baby, doctor and hospital should be reviewed so that you will be prepared to pay any costs not covered by your insurance plan. If you do not have medical insurance,   there are usually clinics and services available for you in your community. Take 30 milligrams of iron during your pregnancy as prescribed by your doctor to reduce the risk of low red blood cells (anemia) later in pregnancy. All women of childbearing age should eat a diet rich in iron.  J  There should be a joint effort for the mother, father and any other children to adapt to the pregnancy financially, emotionally, and psychologically during the pregnancy. Join a support group for moms-to-be. Or, join a class on parenting or childbirth. Have the family participate when possible.  K  Know your limits. Let your caregiver know if you experience any of the  following:   · Pain of any kind.  · Strong cramps.  · You develop a lot of weight in a short period of time (5 pounds in 3 to 5 days).  · Vaginal bleeding, leaking of amniotic fluid.  · Headache, vision problems.  · Dizziness, fainting, shortness of breath.  · Chest pain.  · Fever of 102° F (38.9° C) or higher.  · Gush of clear fluid from your vagina.  · Painful urination.  · Domestic violence.  · Irregular heartbeat (palpitations).  · Rapid beating of the heart (tachycardia).  · Constant feeling sick to your stomach (nauseous) and vomiting.  · Trouble walking, fluid retention (edema).  · Muscle weakness.  · If your baby has decreased activity.  · Persistent diarrhea.  · Abnormal vaginal discharge.  · Uterine contractions at 20-minute intervals.  · Back pain that travels down your leg.  L  Learn and practice that what you eat and drink should be in moderation and healthy for you and your baby. Legal drugs such as alcohol and caffeine are important issues for pregnant women. There is no safe amount of alcohol a woman can drink while pregnant. Fetal alcohol syndrome, a disorder characterized by growth retardation, facial abnormalities, and central nervous system dysfunction, is caused by a woman's use of alcohol during pregnancy. Caffeine, found in tea, coffee, soft drinks and chocolate, should also be limited. Be sure to read labels when trying to cut down on caffeine during pregnancy. More than 200 foods, beverages, and over-the-counter medications contain caffeine and have a high salt content! There are coffees and teas that do not contain caffeine.  M  Medical conditions such as diabetes, epilepsy, and high blood pressure should be treated and kept under control before pregnancy when possible, but especially during pregnancy. Ask your caregiver about any medications that may need to be changed or adjusted during pregnancy. If you are currently taking any medications, ask your caregiver if it is safe to take them  while you are pregnant or before getting pregnant when possible. Also, be sure to discuss any herbs or vitamins you are taking. They are medicines, too! Discuss with your doctor all medications, prescribed and over-the-counter, that you are taking. During your prenatal visit, discuss the medications your doctor may give you during labor and delivery.  N  Never be afraid to ask your doctor or caregiver questions about your health, the progress of the pregnancy, family problems, stressful situations, and recommendation for a pediatrician, if you do not have one. It is better to take all precautions and discuss any questions or concerns you may have during your office visits. It is a good idea to write down your questions before you visit the doctor.  O  Over-the-counter cough and cold remedies may contain alcohol or other ingredients that should   be avoided during pregnancy. Ask your caregiver about prescription, herbs or over-the-counter medications that you are taking or may consider taking while pregnant.   P  Physical activity during pregnancy can benefit both you and your baby by lessening discomfort and fatigue, providing a sense of well-being, and increasing the likelihood of early recovery after delivery. Light to moderate exercise during pregnancy strengthens the belly (abdominal) and back muscles. This helps improve posture. Practicing yoga, walking, swimming, and cycling on a stationary bicycle are usually safe exercises for pregnant women. Avoid scuba diving, exercise at high altitudes (over 3000 feet), skiing, horseback riding, contact sports, etc. Always check with your doctor before beginning any kind of exercise, especially during pregnancy and especially if you did not exercise before getting pregnant.  Q  Queasiness, stomach upset and morning sickness are common during pregnancy. Eating a couple of crackers or dry toast before getting out of bed. Foods that you normally love may make you feel sick to  your stomach. You may need to substitute other nutritious foods. Eating 5 or 6 small meals a day instead of 3 large ones may make you feel better. Do not drink with your meals, drink between meals. Questions that you have should be written down and asked during your prenatal visits.  R  Read about and make plans to baby-proof your home. There are important tips for making your home a safer environment for your baby. Review the tips and make your home safer for you and your baby. Read food labels regarding calories, salt and fat content in the food.  S  Saunas, hot tubs, and steam rooms should be avoided while you are pregnant. Excessive high heat may be harmful during your pregnancy. Your caregiver will screen and examine you for sexually transmitted diseases and genetic disorders during your prenatal visits. Learn the signs of labor. Sexual relations while pregnant is safe unless there is a medical or pregnancy problem and your caregiver advises against it.  T  Traveling long distances should be avoided especially in the third trimester of your pregnancy. If you do have to travel out of state, be sure to take a copy of your medical records and medical insurance plan with you. You should not travel long distances without seeing your doctor first. Most airlines will not allow you to travel after 36 weeks of pregnancy. Toxoplasmosis is an infection caused by a parasite that can seriously harm an unborn baby. Avoid eating undercooked meat and handling cat litter. Be sure to wear gloves when gardening. Tingling of the hands and fingers is not unusual and is due to fluid retention. This will go away after the baby is born.  U  Womb (uterus) size increases during the first trimester. Your kidneys will begin to function more efficiently. This may cause you to feel the need to urinate more often. You may also leak urine when sneezing, coughing or laughing. This is due to the growing uterus pressing against your bladder,  which lies directly in front of and slightly under the uterus during the first few months of pregnancy. If you experience burning along with frequency of urination or bloody urine, be sure to tell your doctor. The size of your uterus in the third trimester may cause a problem with your balance. It is advisable to maintain good posture and avoid wearing high heels during this time. An ultrasound of your baby may be necessary during your pregnancy and is safe for you and your baby.  V    Vaccinations are an important concern for pregnant women. Get needed vaccines before pregnancy. Center for Disease Control (www.cdc.gov) has clear guidelines for the use of vaccines during pregnancy. Review the list, be sure to discuss it with your doctor. Prenatal vitamins are helpful and healthy for you and the baby. Do not take extra vitamins except what is recommended. Taking too much of certain vitamins can cause overdose problems. Continuous vomiting should be reported to your caregiver. Varicose veins may appear especially if there is a family history of varicose veins. They should subside after the delivery of the baby. Support hose helps if there is leg discomfort.  W  Being overweight or underweight during pregnancy may cause problems. Try to get within 15 pounds of your ideal weight before pregnancy. Remember, pregnancy is not a time to be dieting! Do not stop eating or start skipping meals as your weight increases. Both you and your baby need the calories and nutrition you receive from a healthy diet. Be sure to consult with your doctor about your diet. There is a formula and diet plan available depending on whether you are overweight or underweight. Your caregiver or nutritionist can help and advise you if necessary.  X  Avoid X-rays. If you must have dental work or diagnostic tests, tell your dentist or physician that you are pregnant so that extra care can be taken. X-rays should only be taken when the risks of not taking  them outweigh the risk of taking them. If needed, only the minimum amount of radiation should be used. When X-rays are necessary, protective lead shields should be used to cover areas of the body that are not being X-rayed.  Y  Your baby loves you. Breastfeeding your baby creates a loving and very close bond between the two of you. Give your baby a healthy environment to live in while you are pregnant. Infants and children require constant care and guidance. Their health and safety should be carefully watched at all times. After the baby is born, rest or take a nap when the baby is sleeping.  Z  Get your ZZZs. Be sure to get plenty of rest. Resting on your side as often as possible, especially on your left side is advised. It provides the best circulation to your baby and helps reduce swelling. Try taking a nap for 30 to 45 minutes in the afternoon when possible. After the baby is born rest or take a nap when the baby is sleeping. Try elevating your feet for that amount of time when possible. It helps the circulation in your legs and helps reduce swelling.   Most information courtesy of the CDC.  Document Released: 04/24/2005 Document Revised: 07/17/2011 Document Reviewed: 01/06/2009  [employer]® Patient Information ©2014 [employer].

## 2013-05-13 LAB — GC/CHLAMYDIA PROBE AMP
CT Probe RNA: NEGATIVE
GC Probe RNA: NEGATIVE

## 2013-05-22 ENCOUNTER — Encounter (HOSPITAL_COMMUNITY): Payer: Self-pay | Admitting: Family

## 2013-05-22 ENCOUNTER — Inpatient Hospital Stay (HOSPITAL_COMMUNITY): Payer: Medicaid Other

## 2013-05-22 ENCOUNTER — Inpatient Hospital Stay (HOSPITAL_COMMUNITY)
Admission: AD | Admit: 2013-05-22 | Discharge: 2013-05-22 | Disposition: A | Discharge status: Home / Self Care | Payer: Medicaid Other | Source: Ambulatory Visit | Attending: Obstetrics & Gynecology | Admitting: Obstetrics & Gynecology

## 2013-05-22 DIAGNOSIS — O209 Hemorrhage in early pregnancy, unspecified: Secondary | ICD-10-CM | POA: Insufficient documentation

## 2013-05-22 DIAGNOSIS — R109 Unspecified abdominal pain: Secondary | ICD-10-CM | POA: Insufficient documentation

## 2013-05-22 DIAGNOSIS — F17200 Nicotine dependence, unspecified, uncomplicated: Secondary | ICD-10-CM | POA: Insufficient documentation

## 2013-05-22 DIAGNOSIS — D259 Leiomyoma of uterus, unspecified: Secondary | ICD-10-CM | POA: Insufficient documentation

## 2013-05-22 DIAGNOSIS — O3410 Maternal care for benign tumor of corpus uteri, unspecified trimester: Secondary | ICD-10-CM | POA: Insufficient documentation

## 2013-05-22 DIAGNOSIS — O4690 Antepartum hemorrhage, unspecified, unspecified trimester: Secondary | ICD-10-CM

## 2013-05-22 LAB — CBC
HEMATOCRIT: 37.9 % (ref 36.0–46.0)
HEMOGLOBIN: 12.8 g/dL (ref 12.0–15.0)
MCH: 29.1 pg (ref 26.0–34.0)
MCHC: 33.8 g/dL (ref 30.0–36.0)
MCV: 86.1 fL (ref 78.0–100.0)
Platelets: 229 10*3/uL (ref 150–400)
RBC: 4.4 MIL/uL (ref 3.87–5.11)
RDW: 14 % (ref 11.5–15.5)
WBC: 7.6 10*3/uL (ref 4.0–10.5)

## 2013-05-22 LAB — HCG, QUANTITATIVE, PREGNANCY: hCG, Beta Chain, Quant, S: 21764 m[IU]/mL — ABNORMAL HIGH (ref <5)

## 2013-05-22 NOTE — [facility] Note (Signed)
38 yo, G6P2 at [redacted]w[redacted]d by LMP, presents to [facility] with c/o vaginal bleeding x 30 minutes; reports she had no cramping before and felt gush of blood in underwear; bled through clothes. Cramping at this time (5/10 pain), bilateral lower pelvis.

## 2013-05-22 NOTE — Discharge Instructions (Signed)
Vaginal Bleeding During Pregnancy, First Trimester °A small amount of bleeding (spotting) from the vagina is relatively common in early pregnancy. It usually stops on its own. Various things may cause bleeding or spotting in early pregnancy. Some bleeding may be related to the pregnancy, and some may not. In most cases, the bleeding is normal and is not a problem. However, bleeding can also be a sign of something serious. Be sure to tell your health care provider about any vaginal bleeding right away. °Some possible causes of vaginal bleeding during the first trimester include: °· Infection or inflammation of the cervix. °· Growths (polyps) on the cervix. °· Miscarriage or threatened miscarriage. °· Pregnancy tissue has developed outside of the uterus and in a fallopian tube (tubal pregnancy). °· Tiny cysts have developed in the uterus instead of pregnancy tissue (molar pregnancy). °HOME CARE INSTRUCTIONS  °Watch your condition for any changes. The following actions may help to lessen any discomfort you are feeling: °· Follow your health care provider's instructions for limiting your activity. If your health care provider orders bed rest, you may need to stay in bed and only get up to use the bathroom. However, your health care provider may allow you to continue light activity. °· If needed, make plans for someone to help with your regular activities and responsibilities while you are on bed rest. °· Keep track of the number of pads you use each day, how often you change pads, and how soaked (saturated) they are. Write this down. °· Do not use tampons. Do not douche. °· Do not have sexual intercourse or orgasms until approved by your health care provider. °· If you pass any tissue from your vagina, save the tissue so you can show it to your health care provider. °· Only take over-the-counter or prescription medicines as directed by your health care provider. °· Do not take aspirin because it can make you  bleed. °· Keep all follow-up appointments as directed by your health care provider. °SEEK MEDICAL CARE IF: °· You have any vaginal bleeding during any part of your pregnancy. °· You have cramps or labor pains. °SEEK IMMEDIATE MEDICAL CARE IF:  °· You have severe cramps in your back or belly (abdomen). °· You have a fever, not controlled by medicine. °· You pass large clots or tissue from your vagina. °· Your bleeding increases. °· You feel lightheaded or weak, or you have fainting episodes. °· You have chills. °· You are leaking fluid or have a gush of fluid from your vagina. °· You pass out while having a bowel movement. °MAKE SURE YOU: °· Understand these instructions. °· Will watch your condition. °· Will get help right away if you are not doing well or get worse. °Document Released: 02/01/2005 Document Revised: 02/12/2013 Document Reviewed: 12/30/2012 °[employer]® Patient Information ©2014 [employer]. ° °

## 2013-05-22 NOTE — MAU Provider Note (Signed)
History     CSN: [id number]  Arrival date and time: 05/22/13 1130   First Provider Initiated Contact with Patient 05/22/13 1158      Chief Complaint  Patient presents with  . Vaginal Bleeding   HPI  38 yo, G6P2 at [redacted]w[redacted]d by LMP, presents to MAU with c/o vaginal bleeding x 30 minutes; reports she had no cramping before and felt gush of blood in underwear; bled through clothes. Cramping at this time (5/10 pain), bilateral lower pelvis.    Past Medical History  Diagnosis Date  . Pseudotumor cerebri   . Pregnancy induced hypertension   . Infection     UTI  . Abnormal Pap smear     Past Surgical History  Procedure Laterality Date  . Cesarean section    . Colposcopy    . Induced abortion      Family History  Problem Relation Age of Onset  . Hypertension Mother   . Cancer Maternal Grandmother     pancreatic    History  Substance Use Topics  . Smoking status: Current Some Day Smoker    Types: Cigarettes  . Smokeless tobacco: Current User  . Alcohol Use: Yes     Comment: occasional    Allergies: No Known Allergies  Prescriptions prior to admission  Medication Sig Dispense Refill  . metroNIDAZOLE (FLAGYL) 500 MG tablet Take 1 tablet (500 mg total) by mouth 2 (two) times daily.  14 tablet  0  . ondansetron (ZOFRAN ODT) 4 MG disintegrating tablet Take 1 tablet (4 mg total) by mouth every 8 (eight) hours as needed for nausea or vomiting.  5 tablet  2  . Prenatal Vit-Fe Fumarate-FA (PRENATAL VITAMINS) 28-0.8 MG TABS Take 1 tablet by mouth daily.  90 tablet  1  . promethazine (PHENERGAN) 25 MG tablet Take 1 tablet (25 mg total) by mouth every 6 (six) hours as needed for nausea or vomiting.  30 tablet  0    Review of Systems  Gastrointestinal: Positive for abdominal pain (cramping). Negative for nausea and vomiting.  Genitourinary:       Vaginal bleeding  All other systems reviewed and are negative.   Physical Exam   Blood pressure 131/74, pulse 68, temperature  98.6 F (37 C), temperature source Oral, resp. rate 15, last menstrual period 04/10/2013.  Physical Exam  Constitutional: She is oriented to person, place, and time. She appears well-developed and well-nourished. No distress.  HENT:  Head: Normocephalic.  Neck: Normal range of motion. Neck supple.  Cardiovascular: Normal rate, regular rhythm and normal heart sounds.   Respiratory: Effort normal and breath sounds normal. No respiratory distress.  GI: Soft. There is no tenderness.  Genitourinary: There is bleeding ( scant) around the vagina.  Cervix - visually closed  Musculoskeletal: Normal range of motion.  Neurological: She is alert and oriented to person, place, and time.  Skin: Skin is warm and dry.    MAU Course  Procedures  Results for orders placed during the hospital encounter of 05/22/13 (from the past 24 hour(s))  CBC     Status: None   Collection Time    05/22/13 12:30 PM      Result Value Range   WBC 7.6  4.0 - 10.5 K/uL   RBC 4.40  3.87 - 5.11 MIL/uL   Hemoglobin 12.8  12.0 - 15.0 g/dL   HCT 37.9  36.0 - 46.0 %   MCV 86.1  78.0 - 100.0 fL   MCH 29.1  26.0 -  34.0 pg   MCHC 33.8  30.0 - 36.0 g/dL   RDW 14.0  11.5 - 15.5 %   Platelets 229  150 - 400 K/uL   Ultrasound: FINDINGS:  Intrauterine gestational sac: Visualized/normal in shape, low-lying  within the lower uterine segment.  Yolk sac: Present  Embryo: Present  Cardiac Activity: Present  Heart Rate: 102 bpm  CRL: 3.3 mm 6 w 0 d [place] EDC: 01/15/2014  Maternal uterus/adnexae: Moderate subchronic hemorrhage.  1.6 x 1.5 x 1.5 cm right uterine body fibroid.  Left ovary is within normal limits, measuring 2.1 x 1.3 x 1.9 cm.  Right ovary measures 2.8 x 2.8 x 2.0 cm and is notable for a corpus  luteal cyst.  IMPRESSION:  Single live intrauterine gestation with estimated gestational age [redacted]  weeks 0 days by crown-rump length.  Assessment and Plan  38 yo [id number] at [redacted]w[redacted]d wks IUP Bleeding During Pregnancy - Mod  Subchorionic Hemorrhage Uterine Fibroid  Plan: Reviewed results Bleeding precautions Begin prenatal care   [facility] 05/22/2013, 12:00 PM

## 2013-05-30 ENCOUNTER — Encounter (HOSPITAL_COMMUNITY): Payer: Self-pay | Admitting: *Deleted

## 2013-05-30 ENCOUNTER — Inpatient Hospital Stay (HOSPITAL_COMMUNITY)
Admission: AD | Admit: 2013-05-30 | Discharge: 2013-05-30 | Disposition: A | Discharge status: Home / Self Care | Payer: Medicaid Other | Source: Ambulatory Visit | Attending: Obstetrics & Gynecology | Admitting: Obstetrics & Gynecology

## 2013-05-30 DIAGNOSIS — O209 Hemorrhage in early pregnancy, unspecified: Secondary | ICD-10-CM | POA: Insufficient documentation

## 2013-05-30 DIAGNOSIS — E669 Obesity, unspecified: Secondary | ICD-10-CM | POA: Insufficient documentation

## 2013-05-30 DIAGNOSIS — O99210 Obesity complicating pregnancy, unspecified trimester: Secondary | ICD-10-CM

## 2013-05-30 DIAGNOSIS — R109 Unspecified abdominal pain: Secondary | ICD-10-CM | POA: Insufficient documentation

## 2013-05-30 DIAGNOSIS — O2390 Unspecified genitourinary tract infection in pregnancy, unspecified trimester: Secondary | ICD-10-CM | POA: Insufficient documentation

## 2013-05-30 DIAGNOSIS — B3731 Acute candidiasis of vulva and vagina: Secondary | ICD-10-CM

## 2013-05-30 DIAGNOSIS — L293 Anogenital pruritus, unspecified: Secondary | ICD-10-CM | POA: Insufficient documentation

## 2013-05-30 DIAGNOSIS — B373 Candidiasis of vulva and vagina: Secondary | ICD-10-CM

## 2013-05-30 MED ORDER — PRENATAL PLUS 27-1 MG PO TABS: 1.0000 | ORAL_TABLET | Freq: Every day | ORAL | Status: DC

## 2013-05-30 MED ORDER — FLUCONAZOLE 150 MG PO TABS: 150.0000 mg | ORAL_TABLET | Freq: Once | ORAL | Status: DC

## 2013-05-30 NOTE — MAU Note (Signed)
Patient presents with complaint of vaginal bleeding X 2 weeks. 

## 2013-05-30 NOTE — Discharge Instructions (Signed)
Candidal Vulvovaginitis Candidal vulvovaginitis is an infection of the vagina and vulva. The vulva is the skin around the opening of the vagina. This may cause itching and discomfort in and around the vagina.  HOME CARE  Only take medicine as told by your doctor.  Do not have sex (intercourse) until the infection is healed or as told by your doctor.  Practice safe sex.  Tell your sex partner about your infection.  Do not douche or use tampons.  Wear cotton underwear. Do not wear tight pants or panty hose.  Eat yogurt. This may help treat and prevent yeast infections. GET HELP RIGHT AWAY IF:   You have a fever.  Your problems get worse during treatment or do not get better in 3 days.  You have discomfort, irritation, or itching in your vagina or vulva area.  You have pain after sex.  You start to get belly (abdominal) pain. MAKE SURE YOU:  Understand these instructions.  Will watch your condition.  Will get help right away if you are not doing well or get worse. Document Released: 07/21/2008 Document Revised: 07/17/2011 Document Reviewed: 07/21/2008 [facility] Patient Information 2014 [place], [place].  Pelvic Rest Pelvic rest is sometimes recommended for women when:   The placenta is partially or completely covering the opening of the cervix (placenta previa).  There is bleeding between the uterine wall and the amniotic sac in the first trimester (subchorionic hemorrhage).  The cervix begins to open without labor starting (incompetent cervix, cervical insufficiency).  The labor is too early (preterm labor). HOME CARE INSTRUCTIONS  Do not have sexual intercourse, stimulation, or an orgasm.  Do not use tampons, douche, or put anything in the vagina.  Do not lift anything over 10 pounds (4.5 kg).  Avoid strenuous activity or straining your pelvic muscles. SEEK MEDICAL CARE IF:  You have any vaginal bleeding during pregnancy. Treat this as a potential  emergency.  You have cramping pain felt low in the stomach (stronger than menstrual cramps).  You notice vaginal discharge (watery, mucus, or bloody).  You have a low, dull backache.  There are regular contractions or uterine tightening. SEEK IMMEDIATE MEDICAL CARE IF: You have vaginal bleeding and have placenta previa.  Document Released: 08/19/2010 Document Revised: 07/17/2011 Document Reviewed: 08/19/2010 [facility] Patient Information 2014 [place].

## 2013-05-30 NOTE — MAU Provider Note (Signed)
Chief Complaint  Patient presents with  . Vaginal Bleeding    Subjective Beverly Martinez 38 y.o.  [id number] at [redacted]w[redacted]d by LMP presents with ongong pink vaginal bleeding. Describes scant amounts of mucusy pink-brown streaking on underclothes, not heavy enough for a pad. Occasionally has menstrual-like crampy lower abdominal pain; no pain now. Also has vaginal itching since shortly after tx for BV 05/12/13 and little relief from Monistat.vaginal discharge. No dysuria or hematuria. Viable IUP with moderate [place] at [redacted]w[redacted]d on 05/22/13 here. GC/CT neg. Her chief concern is that she says she was not told why she is bleeding or what the US showed at last visit.  Needs proof of pregnancy, note for Nursing program that she has no restrictions, PNV Rx and list of providers. Blood type: B pos  ROS: essentially neg except fatigue and as above   Pregnancy course: see above. NPC. BV and SCH/EPB.   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 Pregnancy unplanned but accepted  No prescriptions prior to admission    No Known Allergies   Objective   Filed Vitals:   05/30/13 1659  BP: 101/65  Pulse: 81  Temp: 98.4 F (36.9 C)  Resp: 20     Physical Exam General: WN/WD in NAD  Abdom: soft, NT Pelvic exam deferred Lab Results No results found for this or any previous visit (from the past 24 hour(s)).  Ultrasound  No results found.  Assessment 1. Bleeding in early pregnancy   2. Vaginal yeast infection   [id number] at [redacted]w[redacted]d Poor Ob hx Obesity   Plan    After lengthy review of her [place] report and explanation of [facility], and s/sx of SAB to look for, she is discharged home with bleeding precautions See AVS for pt education   Medication List         fluconazole 150 MG tablet  Commonly known as:  DIFLUCAN  Take 1 tablet (150 mg total) by mouth once.     prenatal  vitamin w/FE, FA 27-1 MG Tabs tablet  Take 1 tablet by mouth daily.       Follow-up Information   Follow up with [facility] [place]. (See list of prenatal care providers)    Contact information:   [address] [place] [address] [phone]     Pregnancy verification, list of providers, note that she has no work/schol restrictions given.   Beverly Martinez 05/30/2013 5:40 PM

## 2013-07-02 ENCOUNTER — Inpatient Hospital Stay (HOSPITAL_COMMUNITY): Payer: Medicaid Other

## 2013-07-02 ENCOUNTER — Inpatient Hospital Stay (HOSPITAL_COMMUNITY)
Admission: AD | Admit: 2013-07-02 | Discharge: 2013-07-02 | Disposition: A | Discharge status: Home / Self Care | Payer: Medicaid Other | Source: Ambulatory Visit | Attending: Obstetrics & Gynecology | Admitting: Obstetrics & Gynecology

## 2013-07-02 ENCOUNTER — Encounter (HOSPITAL_COMMUNITY): Payer: Self-pay | Admitting: *Deleted

## 2013-07-02 DIAGNOSIS — F17200 Nicotine dependence, unspecified, uncomplicated: Secondary | ICD-10-CM | POA: Insufficient documentation

## 2013-07-02 DIAGNOSIS — O021 Missed abortion: Secondary | ICD-10-CM | POA: Insufficient documentation

## 2013-07-02 LAB — URINALYSIS, ROUTINE W REFLEX MICROSCOPIC
Bilirubin Urine: NEGATIVE
GLUCOSE, UA: NEGATIVE mg/dL
Hgb urine dipstick: NEGATIVE
Ketones, ur: NEGATIVE mg/dL
LEUKOCYTES UA: NEGATIVE
Nitrite: NEGATIVE
PH: 6 (ref 5.0–8.0)
PROTEIN: NEGATIVE mg/dL
SPECIFIC GRAVITY, URINE: 1.025 (ref 1.005–1.030)
Urobilinogen, UA: 0.2 mg/dL (ref 0.0–1.0)

## 2013-07-02 LAB — CBC
HEMATOCRIT: 37 % (ref 36.0–46.0)
HEMOGLOBIN: 12.7 g/dL (ref 12.0–15.0)
MCH: 29.7 pg (ref 26.0–34.0)
MCHC: 34.3 g/dL (ref 30.0–36.0)
MCV: 86.4 fL (ref 78.0–100.0)
Platelets: 222 10*3/uL (ref 150–400)
RBC: 4.28 MIL/uL (ref 3.87–5.11)
RDW: 13.6 % (ref 11.5–15.5)
WBC: 6.8 10*3/uL (ref 4.0–10.5)

## 2013-07-02 MED ORDER — PROMETHAZINE HCL 12.5 MG PO TABS: 12.5000 mg | ORAL_TABLET | Freq: Four times a day (QID) | ORAL | Status: DC | PRN

## 2013-07-02 MED ORDER — OXYCODONE-ACETAMINOPHEN 5-325 MG PO TABS
1.0000 | ORAL_TABLET | Freq: Once | ORAL | Status: AC
  Administered 2013-07-02: 1 via ORAL
  Filled 2013-07-02: qty 1

## 2013-07-02 MED ORDER — OXYCODONE-ACETAMINOPHEN 5-325 MG PO TABS: 2.0000 | ORAL_TABLET | ORAL | Status: DC | PRN

## 2013-07-02 MED ORDER — IBUPROFEN 200 MG PO TABS: 400.0000 mg | ORAL_TABLET | Freq: Four times a day (QID) | ORAL | Status: DC | PRN

## 2013-07-02 MED ORDER — MISOPROSTOL 200 MCG PO TABS: 800.0000 ug | ORAL_TABLET | Freq: Once | ORAL | Status: DC

## 2013-07-02 NOTE — MAU Note (Signed)
Patient states she has had lower abdominal pain since yesterday. States today she has had a sharp shooting pain on the right side down. Denies bleeding or discharge, nausea or diarrhea or S/S of the flu.

## 2013-07-02 NOTE — Discharge Instructions (Signed)

## 2013-07-02 NOTE — MAU Provider Note (Signed)
None     Chief Complaint:  Abdominal Pain   Beverly Martinez is  38 y.o. [id number] at [redacted]w[redacted]d presents complaining of Abdominal Pain  No LOF, no VB, +cramping does not know if ctx.  Pt reports cramping coming and going since yesterday. Pt has a sharp pain shot through left sided pain this morning and occurred 4 x today.   No fevers, no chills, no urinary symptoms, normal BMs, tolerating PO, no n/v, no cp, no sob, no ha, +breast tenderness.   Obstetrical/Gynecological History: OB History   Grav Para Term Preterm Abortions TAB SAB Ect Mult Living   6 2  2 3 2 1   2      Past Medical History: Past Medical History  Diagnosis Date  . Pseudotumor cerebri   . Pregnancy induced hypertension   . Infection     UTI  . Abnormal Pap smear     Past Surgical History: Past Surgical History  Procedure Laterality Date  . Cesarean section    . Colposcopy    . Induced abortion    . Wisdom tooth extraction    . Lumbar puncture      Family History: Family History  Problem Relation Age of Onset  . Hypertension Mother   . Cancer Maternal Grandmother     pancreatic    Social History: History  Substance Use Topics  . Smoking status: Current Every Day Smoker    Types: Cigarettes  . Smokeless tobacco: Current User  . Alcohol Use: Yes     Comment: occasional    Allergies: No Known Allergies  Meds:  Prescriptions prior to admission  Medication Sig Dispense Refill  . Cetirizine HCl (ZYRTEC PO) Take 1 tablet by mouth as needed (allergies, sinuses).      . diphenhydrAMINE (BENADRYL) 25 MG tablet Take 25 mg by mouth every 6 (six) hours as needed for allergies.      . prenatal vitamin w/FE, FA (PRENATAL 1 + 1) 27-1 MG TABS tablet Take 1 tablet by mouth daily.  30 each  0    Review of Systems -   See HPI Left quadrant pain. No vaginal bleeding, no ctx, no lof   Physical Exam  Blood pressure 131/58, pulse 79, temperature 98.8 F (37.1 C), temperature source Oral, resp. rate 20, height  5' 3.5" (1.613 m), weight 108.954 kg (240 lb 3.2 oz), last menstrual period 04/10/2013, SpO2 100.00%. GENERAL: Well-developed, well-nourished female in no acute distress.  LUNGS: Clear to auscultation bilaterally.  HEART: Regular rate and rhythm. ABDOMEN: Soft, nontender, nondistended, gravid.  EXTREMITIES: Nontender, no edema, 2+ distal pulses.    Labs: Results for orders placed during the hospital encounter of 07/02/13 (from the past 24 hour(s))  URINALYSIS, ROUTINE W REFLEX MICROSCOPIC   Collection Time    07/02/13  1:01 PM      Result Value Ref Range   Color, Urine YELLOW  YELLOW   APPearance CLEAR  CLEAR   Specific Gravity, Urine 1.025  1.005 - 1.030   pH 6.0  5.0 - 8.0   Glucose, UA NEGATIVE  NEGATIVE mg/dL   Hgb urine dipstick NEGATIVE  NEGATIVE   Bilirubin Urine NEGATIVE  NEGATIVE   Ketones, ur NEGATIVE  NEGATIVE mg/dL   Protein, ur NEGATIVE  NEGATIVE mg/dL   Urobilinogen, UA 0.2  0.0 - 1.0 mg/dL   Nitrite NEGATIVE  NEGATIVE   Leukocytes, UA NEGATIVE  NEGATIVE   Imaging Studies:  EXAM:  OBSTETRIC <14 WK ULTRASOUND  TECHNIQUE:  Transabdominal  ultrasound was performed for evaluation of the  gestation as well as the maternal uterus and adnexal regions.  COMPARISON: 05/22/2013  FINDINGS:  There is a large, bizarre appearing gestational sac which is  deformed by an eccentric 3cm heterogeneous, but predominant  hyperechoic mass which is nonvascular. This has the appearance of a  chorionic bump. There was no uterine mass seen on previous imaging.  A small fetus is suspected at the lower margin of the gestational  sac, measuring only 6 mm in length. No fetal cardiac activity. There  is no normal 11 week 6 day fetus (which would be expected based on  previous sonographic dating). There is no evidence of adnexal mass  or ovarian enlargement. No free pelvic fluid.  IMPRESSION:  1. Findings meet definitive criteria for failed pregnancy. This  follows SRU consensus  guidelines: Diagnostic Criteria for Nonviable  Pregnancy Early in the First Trimester. N Engl J Med  2013;369:1443-51.  2. 3 cm gestational mass that is most consistent with hematoma.  Assessment: Beverly Martinez is  38 y.o. [id number] at [redacted]w[redacted]d by 6wk [place] presents with missed AB based on [place] today. CBC  Plan: Missed ab based on [place]. Given Rx for cytotec 845mcg. Pt meets criteria below. Will discharge home with follow up next week in clinic for reeval. Discussed with Dr. Glo Herring.   Fredrik Rigger 2/25/20153:03 PM   Early Intrauterine Pregnancy Failure  __x_  Documented intrauterine pregnancy failure less than or equal to [redacted] weeks gestation  _x__  No serious current illness  _x__  Baseline Hgb greater than or equal to 10g/dl Hemoglobin & Hematocrit     Component Value Date/Time   HGB 12.8 05/22/2013 1230   HCT 37.9 05/22/2013 1230    _x__  Patient has easily accessible transportation to the hospital  _x__  Clear preference  _x__  Practitioner/physician deems patient reliable  _x__  Counseling by practitioner or physician  _x__  Patient education by RN  ___  Consent form signed at home  _NA__  Rho-Gam given by RN if indicated  _x__ Medication dispensed   _x__   Cytotec 800 mcg  _x_   Intravaginally by patient at home         __   Intravaginally by RN in MAU        __   Rectally by patient at home        __   Rectally by RN in MAU  _x_  Ibuprofen 600 mg 1 tablet by mouth every 6 hours as needed #30  _x__  Hydrocodone/acetaminophen 5/325 mg by mouth every 4 to 6 hours as needed  _x__  Phenergan 12.5 mg by mouth every 4 hours as needed for nausea

## 2013-07-03 ENCOUNTER — Encounter: Payer: Self-pay | Admitting: Family Medicine

## 2013-07-03 DIAGNOSIS — O021 Missed abortion: Secondary | ICD-10-CM | POA: Insufficient documentation

## 2013-07-11 ENCOUNTER — Encounter: Payer: Self-pay | Admitting: Obstetrics & Gynecology

## 2013-07-12 ENCOUNTER — Encounter: Payer: Self-pay | Admitting: *Deleted

## 2013-07-14 ENCOUNTER — Encounter: Payer: Self-pay | Admitting: Obstetrics & Gynecology

## 2013-07-14 NOTE — Progress Notes (Signed)
Called pt in regards to missing her appt today at 07/14/13 at 1 pm.  Unable to reach the patient due to not having a working telephone #.  Sent a letter to patient.

## 2013-07-31 ENCOUNTER — Emergency Department (HOSPITAL_COMMUNITY)
Admission: EM | Admit: 2013-07-31 | Discharge: 2013-07-31 | Disposition: A | Discharge status: Home / Self Care | Payer: Medicaid Other | Attending: Emergency Medicine | Admitting: Emergency Medicine

## 2013-07-31 ENCOUNTER — Encounter (HOSPITAL_COMMUNITY): Payer: Self-pay | Admitting: Emergency Medicine

## 2013-07-31 DIAGNOSIS — O2390 Unspecified genitourinary tract infection in pregnancy, unspecified trimester: Secondary | ICD-10-CM | POA: Insufficient documentation

## 2013-07-31 DIAGNOSIS — Z8669 Personal history of other diseases of the nervous system and sense organs: Secondary | ICD-10-CM | POA: Insufficient documentation

## 2013-07-31 DIAGNOSIS — N390 Urinary tract infection, site not specified: Secondary | ICD-10-CM | POA: Insufficient documentation

## 2013-07-31 DIAGNOSIS — O99330 Smoking (tobacco) complicating pregnancy, unspecified trimester: Secondary | ICD-10-CM | POA: Insufficient documentation

## 2013-07-31 LAB — URINALYSIS, ROUTINE W REFLEX MICROSCOPIC
Bilirubin Urine: NEGATIVE
Glucose, UA: NEGATIVE mg/dL
Ketones, ur: NEGATIVE mg/dL
Nitrite: NEGATIVE
Protein, ur: 30 mg/dL — AB
SPECIFIC GRAVITY, URINE: 1.019 (ref 1.005–1.030)
Urobilinogen, UA: 1 mg/dL (ref 0.0–1.0)
pH: 7 (ref 5.0–8.0)

## 2013-07-31 LAB — WET PREP, GENITAL
Trich, Wet Prep: NONE SEEN
YEAST WET PREP: NONE SEEN

## 2013-07-31 LAB — URINE MICROSCOPIC-ADD ON

## 2013-07-31 LAB — PREGNANCY, URINE: PREG TEST UR: POSITIVE — AB

## 2013-07-31 MED ORDER — CEPHALEXIN 500 MG PO CAPS
500.0000 mg | ORAL_CAPSULE | Freq: Once | ORAL | Status: AC
  Administered 2013-07-31: 500 mg via ORAL
  Filled 2013-07-31: qty 1

## 2013-07-31 MED ORDER — CEPHALEXIN 500 MG PO CAPS: 500.0000 mg | ORAL_CAPSULE | Freq: Two times a day (BID) | ORAL | Status: DC

## 2013-07-31 NOTE — ED Notes (Addendum)
Patient reports to ED for dysuria, urinary urgency, urinary frequency, blood tinged urine starting 2 days ago. Pt states this "is just like other times" she had a UTI. Patient states she wants her urine tested and to get treated for UTI.

## 2013-07-31 NOTE — Progress Notes (Signed)
[facility] provided pt with a list of primary care resources. Patient stated that she pending insurance.

## 2013-07-31 NOTE — ED Provider Notes (Signed)
CSN: [id number]     Arrival date & time 07/31/13  1133 History   First MD Initiated Contact with Patient 07/31/13 1300     Chief Complaint  Patient presents with  . Dysuria     (Consider location/radiation/quality/duration/timing/severity/associated sxs/prior Treatment) HPI 38 year old female presents with urinary frequency, urgency, and mild dysuria over the past 2-3 days. This feels like multiple prior UTIs. She had a miscarriage after about a 12 week pregnancy last month. She had an ultrasound at [facility] that showed no fetal cardiac activity and no growth since the prior ultrasound was given Cytotec. She had vaginal bleeding which has since resolved. The patient does not have any abdominal pain. She's not restarted her period since this. Denies any fevers, chills, vomiting, or back pain. Denies any vaginal bleeding or discharge.  Past Medical History  Diagnosis Date  . Pseudotumor cerebri   . Pregnancy induced hypertension   . Infection     UTI  . Abnormal Pap smear    Past Surgical History  Procedure Laterality Date  . Cesarean section    . Colposcopy    . Induced abortion    . Wisdom tooth extraction    . Lumbar puncture     Family History  Problem Relation Age of Onset  . Hypertension Mother   . Cancer Maternal Grandmother     pancreatic   History  Substance Use Topics  . Smoking status: Current Every Day Smoker    Types: Cigarettes  . Smokeless tobacco: Current User  . Alcohol Use: Yes     Comment: occasional   OB History   Grav Para Term Preterm Abortions TAB SAB Ect Mult Living   6 2  2 3 2 1   2      Review of Systems  Constitutional: Negative for fever.  Gastrointestinal: Negative for vomiting and abdominal pain.  Genitourinary: Positive for dysuria, urgency, frequency, hematuria and difficulty urinating. Negative for flank pain, decreased urine volume, vaginal bleeding, vaginal discharge and menstrual problem.  All other systems reviewed and are  negative.      Allergies  Review of patient's allergies indicates no known allergies.  Home Medications  No current outpatient prescriptions on file. BP 101/57  Pulse 78  Temp(Src) 98 F (36.7 C) (Oral)  Resp 20  SpO2 100%  LMP 04/10/2013 Physical Exam  Nursing note and vitals reviewed. Constitutional: She is oriented to person, place, and time. She appears well-developed and well-nourished. No distress.  HENT:  Head: Normocephalic and atraumatic.  Right Ear: External ear normal.  Left Ear: External ear normal.  Nose: Nose normal.  Eyes: Right eye exhibits no discharge. Left eye exhibits no discharge.  Cardiovascular: Normal rate, regular rhythm and normal heart sounds.   Pulmonary/Chest: Effort normal and breath sounds normal.  Abdominal: Soft. She exhibits no distension. There is no tenderness.  Genitourinary: Uterus is not tender. Cervix exhibits no motion tenderness. Right adnexum displays no tenderness. Left adnexum displays no tenderness. Vaginal discharge (scant) found.  Neurological: She is alert and oriented to person, place, and time.  Skin: Skin is warm and dry.    ED Course  Procedures (including critical care time) Labs Review Labs Reviewed  WET PREP, GENITAL - Abnormal; Notable for the following:    Clue Cells Wet Prep HPF POC RARE (*)    WBC, Wet Prep HPF POC FEW (*)    All other components within normal limits  URINALYSIS, ROUTINE W REFLEX MICROSCOPIC - Abnormal; Notable for the following:  Color, Urine RED (*)    APPearance TURBID (*)    Hgb urine dipstick LARGE (*)    Protein, ur 30 (*)    Leukocytes, UA LARGE (*)    All other components within normal limits  PREGNANCY, URINE - Abnormal; Notable for the following:    Preg Test, Ur POSITIVE (*)    All other components within normal limits  URINE MICROSCOPIC-ADD ON - Abnormal; Notable for the following:    Bacteria, UA FEW (*)    All other components within normal limits  URINE CULTURE   GC/CHLAMYDIA PROBE AMP   Imaging Review No results found.   EKG Interpretation None      MDM   Final diagnoses:  UTI (lower urinary tract infection)    Patient is well appearing, no back pain/CVA tenderness, fever or vomiting. Recently treated for missed AB, no more vaginal symptoms. Urine c/w UTI. Will give keflex. Pelvic exam unremarkable, scant discharge. Has rare clue cells, relates she just came off flagyl a few days ago. Due to this, feel she does not need to be treated. D/w OB on call, who states that within a month it is not abnormal to have a positive pregnancy test. Recommends treating like normal given there is no bleeding, pain, etc and f/u with GYN as she was previously directed.     Ephraim Hamburger, MD 07/31/13 [phone]

## 2013-08-01 LAB — GC/CHLAMYDIA PROBE AMP
CT Probe RNA: NEGATIVE
GC PROBE AMP APTIMA: NEGATIVE

## 2013-08-02 LAB — URINE CULTURE: Colony Count: >=100000

## 2013-08-03 ENCOUNTER — Telehealth (HOSPITAL_BASED_OUTPATIENT_CLINIC_OR_DEPARTMENT_OTHER): Payer: Self-pay | Admitting: Emergency Medicine

## 2013-08-03 NOTE — Telephone Encounter (Signed)
Post ED Visit - Positive Culture Follow-up  Culture report reviewed by antimicrobial stewardship pharmacist: [] Wes Dulaney, Pharm.D., BCPS [] Jeremy Frens, Pharm.D., BCPS [] Elizabeth Martin, Pharm.D., BCPS [x] Minh Pham, Pharm.D., BCPS, AAHIVP [] Michelle Turner, Pharm.D., BCPS, AAHIVP  Positive urine culture Treated with Keflex, organism sensitive to the same and no further patient follow-up is required at this time.  Beverly Martinez 08/03/2013, 11:19 AM   

## 2013-09-07 ENCOUNTER — Encounter (HOSPITAL_COMMUNITY)
Admission: AD | Disposition: A | Discharge status: Home / Self Care | Payer: Self-pay | Source: Ambulatory Visit | Attending: Obstetrics & Gynecology

## 2013-09-07 ENCOUNTER — Inpatient Hospital Stay (HOSPITAL_COMMUNITY): Payer: Medicaid Other

## 2013-09-07 ENCOUNTER — Encounter (HOSPITAL_COMMUNITY): Payer: Self-pay | Admitting: *Deleted

## 2013-09-07 ENCOUNTER — Ambulatory Visit (HOSPITAL_COMMUNITY)
Admission: AD | Admit: 2013-09-07 | Discharge: 2013-09-07 | Disposition: A | Discharge status: Home / Self Care | Payer: Medicaid Other | Source: Ambulatory Visit | Attending: Obstetrics & Gynecology | Admitting: Obstetrics & Gynecology

## 2013-09-07 ENCOUNTER — Encounter (HOSPITAL_COMMUNITY): Payer: Medicaid Other | Admitting: Anesthesiology

## 2013-09-07 ENCOUNTER — Inpatient Hospital Stay (HOSPITAL_COMMUNITY): Payer: Medicaid Other | Admitting: Anesthesiology

## 2013-09-07 DIAGNOSIS — F17200 Nicotine dependence, unspecified, uncomplicated: Secondary | ICD-10-CM

## 2013-09-07 DIAGNOSIS — O0289 Other abnormal products of conception: Secondary | ICD-10-CM | POA: Insufficient documentation

## 2013-09-07 DIAGNOSIS — O031 Delayed or excessive hemorrhage following incomplete spontaneous abortion: Secondary | ICD-10-CM

## 2013-09-07 DIAGNOSIS — G932 Benign intracranial hypertension: Secondary | ICD-10-CM | POA: Insufficient documentation

## 2013-09-07 HISTORY — PX: DILATION AND EVACUATION: SHX1459

## 2013-09-07 LAB — URINALYSIS, ROUTINE W REFLEX MICROSCOPIC
BILIRUBIN URINE: NEGATIVE
Glucose, UA: NEGATIVE mg/dL
KETONES UR: NEGATIVE mg/dL
Leukocytes, UA: NEGATIVE
Nitrite: NEGATIVE
PH: 6 (ref 5.0–8.0)
Protein, ur: 30 mg/dL — AB
Specific Gravity, Urine: >1.03 — ABNORMAL HIGH (ref 1.005–1.030)
Urobilinogen, UA: 0.2 mg/dL (ref 0.0–1.0)

## 2013-09-07 LAB — WET PREP, GENITAL
Clue Cells Wet Prep HPF POC: NONE SEEN
Trich, Wet Prep: NONE SEEN
Yeast Wet Prep HPF POC: NONE SEEN

## 2013-09-07 LAB — CBC
HEMATOCRIT: 40.9 % (ref 36.0–46.0)
Hemoglobin: 13.9 g/dL (ref 12.0–15.0)
MCH: 29.4 pg (ref 26.0–34.0)
MCHC: 34 g/dL (ref 30.0–36.0)
MCV: 86.7 fL (ref 78.0–100.0)
Platelets: 240 10*3/uL (ref 150–400)
RBC: 4.72 MIL/uL (ref 3.87–5.11)
RDW: 13.7 % (ref 11.5–15.5)
WBC: 8.1 10*3/uL (ref 4.0–10.5)

## 2013-09-07 LAB — URINE MICROSCOPIC-ADD ON

## 2013-09-07 LAB — HCG, QUANTITATIVE, PREGNANCY: hCG, Beta Chain, Quant, S: 14 m[IU]/mL — ABNORMAL HIGH (ref <5)

## 2013-09-07 LAB — TYPE AND SCREEN
ABO/RH(D): B POS
Antibody Screen: NEGATIVE

## 2013-09-07 LAB — POCT PREGNANCY, URINE: Preg Test, Ur: NEGATIVE

## 2013-09-07 SURGERY — DILATION AND EVACUATION, UTERUS
Anesthesia: Monitor Anesthesia Care | Site: Vagina

## 2013-09-07 MED ORDER — ONDANSETRON HCL 4 MG/2ML IJ SOLN
4.0000 mg | Freq: Once | INTRAMUSCULAR | Status: AC
  Administered 2013-09-07: 4 mg via INTRAVENOUS
  Filled 2013-09-07: qty 2

## 2013-09-07 MED ORDER — METOCLOPRAMIDE HCL 5 MG/ML IJ SOLN
INTRAMUSCULAR | Status: AC
  Filled 2013-09-07: qty 2

## 2013-09-07 MED ORDER — IBUPROFEN 800 MG PO TABS: 800.0000 mg | ORAL_TABLET | Freq: Three times a day (TID) | ORAL | Status: DC

## 2013-09-07 MED ORDER — FENTANYL CITRATE 0.05 MG/ML IJ SOLN
INTRAMUSCULAR | Status: DC | PRN
  Administered 2013-09-07: 100 ug via INTRAVENOUS

## 2013-09-07 MED ORDER — LIDOCAINE HCL 1 % IJ SOLN
INTRAMUSCULAR | Status: DC | PRN
  Administered 2013-09-07: 20 mL

## 2013-09-07 MED ORDER — PROPOFOL 10 MG/ML IV BOLUS
INTRAVENOUS | Status: DC | PRN
  Administered 2013-09-07 (×10): 50 mg via INTRAVENOUS

## 2013-09-07 MED ORDER — DEXAMETHASONE SODIUM PHOSPHATE 10 MG/ML IJ SOLN
INTRAMUSCULAR | Status: DC | PRN
  Administered 2013-09-07: 10 mg via INTRAVENOUS

## 2013-09-07 MED ORDER — FENTANYL CITRATE 0.05 MG/ML IJ SOLN
25.0000 ug | INTRAMUSCULAR | Status: DC | PRN
  Administered 2013-09-07: 50 ug via INTRAVENOUS

## 2013-09-07 MED ORDER — MIDAZOLAM HCL 5 MG/5ML IJ SOLN
INTRAMUSCULAR | Status: DC | PRN
  Administered 2013-09-07: 2 mg via INTRAVENOUS

## 2013-09-07 MED ORDER — LIDOCAINE HCL (CARDIAC) 20 MG/ML IV SOLN
INTRAVENOUS | Status: DC | PRN
  Administered 2013-09-07: 50 mg via INTRAVENOUS

## 2013-09-07 MED ORDER — SILVER NITRATE-POT NITRATE 75-25 % EX MISC
CUTANEOUS | Status: DC | PRN
  Administered 2013-09-07: 2

## 2013-09-07 MED ORDER — LACTATED RINGERS IV SOLN
INTRAVENOUS | Status: DC
  Administered 2013-09-07: 18:00:00 via INTRAVENOUS

## 2013-09-07 MED ORDER — LACTATED RINGERS IV SOLN: INTRAVENOUS | Status: DC

## 2013-09-07 MED ORDER — HYDROMORPHONE HCL PF 2 MG/ML IJ SOLN
2.0000 mg | Freq: Once | INTRAMUSCULAR | Status: AC
  Administered 2013-09-07: 2 mg via INTRAVENOUS
  Filled 2013-09-07: qty 1

## 2013-09-07 MED ORDER — PROMETHAZINE HCL 25 MG/ML IJ SOLN
6.2500 mg | INTRAMUSCULAR | Status: DC | PRN
Start: 2013-09-07 — End: 2013-09-08

## 2013-09-07 MED ORDER — DEXTROSE 5 % IN LACTATED RINGERS IV BOLUS
1000.0000 mL | Freq: Once | INTRAVENOUS | Status: AC
  Administered 2013-09-07: 1000 mL via INTRAVENOUS

## 2013-09-07 MED ORDER — METOCLOPRAMIDE HCL 5 MG/ML IJ SOLN
INTRAMUSCULAR | Status: DC | PRN
  Administered 2013-09-07: 10 mg via INTRAVENOUS

## 2013-09-07 MED ORDER — FENTANYL CITRATE 0.05 MG/ML IJ SOLN
INTRAMUSCULAR | Status: AC
  Filled 2013-09-07: qty 2

## 2013-09-07 MED ORDER — DOXYCYCLINE HYCLATE 100 MG IV SOLR
100.0000 mg | Freq: Once | INTRAVENOUS | Status: AC
  Administered 2013-09-07: 100 mg via INTRAVENOUS
  Filled 2013-09-07: qty 100

## 2013-09-07 MED ORDER — MEPERIDINE HCL 25 MG/ML IJ SOLN: 6.2500 mg | INTRAMUSCULAR | Status: DC | PRN

## 2013-09-07 SURGICAL SUPPLY — 22 items
CATH ROBINSON RED A/P 16FR (CATHETERS) ×2 IMPLANT
CLOTH BEACON ORANGE TIMEOUT ST (SAFETY) ×2 IMPLANT
DECANTER SPIKE VIAL GLASS SM (MISCELLANEOUS) ×2 IMPLANT
GLOVE BIO SURGEON STRL SZ7 (GLOVE) ×2 IMPLANT
GLOVE BIOGEL PI IND STRL 7.0 (GLOVE) ×1 IMPLANT
GLOVE BIOGEL PI INDICATOR 7.0 (GLOVE) ×1
GOWN STRL REUS W/TWL LRG LVL3 (GOWN DISPOSABLE) ×4 IMPLANT
KIT BERKELEY 1ST TRIMESTER 3/8 (MISCELLANEOUS) ×2 IMPLANT
NDL SPNL 22GX3.5 QUINCKE BK (NEEDLE) ×1 IMPLANT
NEEDLE SPNL 22GX3.5 QUINCKE BK (NEEDLE) ×2 IMPLANT
NS IRRIG 1000ML POUR BTL (IV SOLUTION) ×2 IMPLANT
PACK VAGINAL MINOR WOMEN LF (CUSTOM PROCEDURE TRAY) ×2 IMPLANT
PAD OB MATERNITY 4.3X12.25 (PERSONAL CARE ITEMS) ×2 IMPLANT
PAD PREP 24X48 CUFFED NSTRL (MISCELLANEOUS) ×2 IMPLANT
SET BERKELEY SUCTION TUBING (SUCTIONS) ×2 IMPLANT
SLEEVE SCD COMPRESS KNEE MED (MISCELLANEOUS) ×1 IMPLANT
SYR CONTROL 10ML LL (SYRINGE) ×2 IMPLANT
TOWEL OR 17X24 6PK STRL BLUE (TOWEL DISPOSABLE) ×4 IMPLANT
VACURETTE 10 RIGID CVD (CANNULA) IMPLANT
VACURETTE 7MM CVD STRL WRAP (CANNULA) IMPLANT
VACURETTE 8 RIGID CVD (CANNULA) ×1 IMPLANT
VACURETTE 9 RIGID CVD (CANNULA) IMPLANT

## 2013-09-07 NOTE — Discharge Instructions (Signed)
Dilation and Curettage or Vacuum Curettage Dilation and curettage (D&C) and vacuum curettage are minor procedures. A D&C involves stretching (dilation) the cervix and scraping (curettage) the inside lining of the womb (uterus). During a D&C, tissue is gently scraped from the inside lining of the uterus. During a vacuum curettage, the lining and tissue in the uterus are removed with the use of gentle suction.  Curettage may be performed to either diagnose or treat a problem. As a diagnostic procedure, curettage is performed to examine tissues from the uterus. A diagnostic curettage may be performed for the following symptoms:   Irregular bleeding in the uterus.   Bleeding with the development of clots.   Spotting between menstrual periods.   Prolonged menstrual periods.   Bleeding after menopause.   No menstrual period (amenorrhea).   A change in size and shape of the uterus.  As a treatment procedure, curettage may be performed for the following reasons:   Removal of an IUD (intrauterine device).   Removal of retained placenta after giving birth. Retained placenta can cause an infection or bleeding severe enough to require transfusions.   Abortion.   Miscarriage.   Removal of polyps inside the uterus.   Removal of uncommon types of noncancerous lumps (fibroids).  LET YOUR HEALTH CARE PROVIDER KNOW ABOUT:   Any allergies you have.   All medicines you are taking, including vitamins, herbs, eye drops, creams, and over-the-counter medicines.   Previous problems you or members of your family have had with the use of anesthetics.   Any blood disorders you have.   Previous surgeries you have had.   Medical conditions you have. RISKS AND COMPLICATIONS  Generally, this is a safe procedure. However, as with any procedure, complications can occur. Possible complications include:  Excessive bleeding.   Infection of the uterus.   Damage to the cervix.    Development of scar tissue (adhesions) inside the uterus, later causing abnormal amounts of menstrual bleeding.   Complications from the general anesthetic, if a general anesthetic is used.   Putting a hole (perforation) in the uterus. This is rare.  BEFORE THE PROCEDURE   Eat and drink before the procedure only as directed by your health care provider.   Arrange for someone to take you home.  PROCEDURE  This procedure usually takes about 15 30 minutes.  You will be given one of the following:  A medicine that numbs the area in and around the cervix (local anesthetic).   A medicine to make you sleep through the procedure (general anesthetic).  You will lie on your back with your legs in stirrups.   A warm metal or plastic instrument (speculum) will be placed in your vagina to keep it open and to allow the health care provider to see the cervix.  There are two ways in which your cervix can be softened and dilated. These include:   Taking a medicine.   Having thin rods (laminaria) inserted into your cervix.   A curved tool (curette) will be used to scrape cells from the inside lining of the uterus. In some cases, gentle suction is applied with the curette. The curette will then be removed.  AFTER THE PROCEDURE   You will rest in the recovery area until you are stable and are ready to go home.   You may feel sick to your stomach (nauseous) or throw up (vomit) if you were given a general anesthetic.   You may have a sore throat if a   tube was placed in your throat during general anesthesia.   You may have light cramping and bleeding. This may last for 2 days to 2 weeks after the procedure.   Your uterus needs to make a new lining after the procedure. This may make your next period late. Document Released: 04/24/2005 Document Revised: 12/25/2012 Document Reviewed: 11/21/2012 [employer] Patient Information 2014 [employer].  

## 2013-09-07 NOTE — Transfer of Care (Signed)
Immediate Anesthesia Transfer of Care Note  Patient: Beverly Martinez  Procedure(s) Performed: Procedure(s): DILATATION AND EVACUATION (N/A)  Patient Location: PACU  Anesthesia Type:MAC  Level of Consciousness: awake, alert , oriented and patient cooperative  Airway & Oxygen Therapy: Patient Spontanous Breathing and Patient connected to nasal cannula oxygen  Post-op Assessment: Report given to PACU RN, Post -op Vital signs reviewed and stable and Patient moving all extremities X 4  Post vital signs: Reviewed and stable  Complications: No apparent anesthesia complications

## 2013-09-07 NOTE — Anesthesia Postprocedure Evaluation (Signed)
  Anesthesia Post-op Note  Patient: Beverly Martinez  Procedure(s) Performed: Procedure(s) (LRB): DILATATION AND EVACUATION (N/A)  Patient Location: PACU  Anesthesia Type: MAC  Level of Consciousness: awake and alert   Airway and Oxygen Therapy: Patient Spontanous Breathing  Post-op Pain: mild  Post-op Assessment: Post-op Vital signs reviewed, Patient's Cardiovascular Status Stable, Respiratory Function Stable, Patent Airway and No signs of Nausea or vomiting  Last Vitals:  Filed Vitals:   09/07/13 2028  BP: 121/67  Pulse: 87  Temp: 36.5 C  Resp: 16    Post-op Vital Signs: stable   Complications: No apparent anesthesia complications

## 2013-09-07 NOTE — Anesthesia Preprocedure Evaluation (Addendum)
Anesthesia Evaluation  Patient identified by MRN, date of birth, ID band Patient awake    Reviewed: Allergy & Precautions, H&P , NPO status , Patient's Chart, lab work & pertinent test results  Airway Mallampati: II TM Distance: >3 FB Neck ROM: Full    Dental no notable dental hx.    Pulmonary neg pulmonary ROS,  breath sounds clear to auscultation  Pulmonary exam normal       Cardiovascular negative cardio ROS  Rhythm:Regular Rate:Normal     Neuro/Psych negative neurological ROS  negative psych ROS   GI/Hepatic negative GI ROS, Neg liver ROS,   Endo/Other  negative endocrine ROSMorbid obesity  Renal/GU negative Renal ROS  negative genitourinary   Musculoskeletal negative musculoskeletal ROS (+)   Abdominal   Peds negative pediatric ROS (+)  Hematology negative hematology ROS (+)   Anesthesia Other Findings   Reproductive/Obstetrics negative OB ROS                          Anesthesia Physical Anesthesia Plan  ASA: II  Anesthesia Plan: MAC   Post-op Pain Management:    Induction:   Airway Management Planned: Natural Airway  Additional Equipment:   Intra-op Plan:   Post-operative Plan:   Informed Consent: I have reviewed the patients History and Physical, chart, labs and discussed the procedure including the risks, benefits and alternatives for the proposed anesthesia with the patient or authorized representative who has indicated his/her understanding and acceptance.   Dental advisory given  Plan Discussed with: CRNA  Anesthesia Plan Comments:         Anesthesia Quick Evaluation

## 2013-09-07 NOTE — H&P (Signed)
Chief Complaint: Abdominal Pain and Vaginal Bleeding   First Provider Initiated Contact with Patient 09/07/13 1510  SUBJECTIVE  HPI: Beverly Martinez is a 38 y.o. [id number] at 2+ months S/P presumed completed SAB who presents by EMS with waking up from a nap this afternoon "in a puddle of blood" followed by severe low abdominal cramping. Diagnosed with 6 week intrauterine pregnancy in January 2015. Seen in maternity admissions 07/02/2013 and diagnosed with missed AB. Fetal pole measured 6 weeks, no cardiac activity, see additional findings below. Received Cytotec. Bled a moderate amount x 4 days. No bleeding or abdominal pain since then. Did not keep appointment for follow up visit with GYN clinic after SAB.  CLINICAL DATA: No fetal heart tones. Pelvic pain  EXAM:  OBSTETRIC <14 WK ULTRASOUND  TECHNIQUE:  Transabdominal ultrasound was performed for evaluation of the  gestation as well as the maternal uterus and adnexal regions.  COMPARISON: 05/22/2013  FINDINGS:  There is a large, bizarre appearing gestational sac which is  deformed by an eccentric 3cm heterogeneous, but predominant  hyperechoic mass which is nonvascular. This has the appearance of a  chorionic bump. There was no uterine mass seen on previous imaging.  A small fetus is suspected at the lower margin of the gestational  sac, measuring only 6 mm in length. No fetal cardiac activity. There  is no normal 11 week 6 day fetus (which would be expected based on  previous sonographic dating). There is no evidence of adnexal mass  or ovarian enlargement. No free pelvic fluid.  IMPRESSION:  1. Findings meet definitive criteria for failed pregnancy. This  follows SRU consensus guidelines: Diagnostic Criteria for Nonviable  Pregnancy Early in the First Trimester. N Engl J Med  2013;369:1443-51.  2. 3 cm gestational mass that is most consistent with hematoma.  Past Medical History   Diagnosis  Date   .  Pseudotumor cerebri    .   Pregnancy induced hypertension    .  Infection      UTI   .  Abnormal Pap smear     OB History   Gravida  Para  Term  Preterm  AB  SAB  TAB  Ectopic  Multiple  Living   6  2   2  3  1  2    2      #  Outcome  Date  GA  Lbr Len/2nd  Weight  Sex  Delivery  Anes  PTL  Lv   6  PRE       CS      Comments: pre-eclampsia   5  PRE   [redacted]w[redacted]d     CS      Comments: pre- eclampsia   4  TAB            3  TAB            2  SAB            1  GRA            Comments: System Generated. Please review and update pregnancy details.      Past Surgical History   Procedure  Laterality  Date   .  Cesarean section     .  Colposcopy     .  Induced abortion     .  Wisdom tooth extraction     .  Lumbar puncture      History    Social History   .  Marital Status:  Single     Spouse Name:  N/A     Number of Children:  N/A   .  Years of Education:  N/A    Occupational History   .  Not on file.    Social History Main Topics   .  Smoking status:  Current Every Day Smoker     Types:  Cigarettes   .  Smokeless tobacco:  Current User   .  Alcohol Use:  Yes      Comment: occasional   .  Drug Use:  No   .  Sexual Activity:  Yes     Birth Control/ Protection:  Condom    Other Topics  Concern   .  Not on file    Social History Narrative   .  No narrative on file    No current facility-administered medications on file prior to encounter.    Current Outpatient Prescriptions on File Prior to Encounter   Medication  Sig  Dispense  Refill   .  cephALEXin (KEFLEX) 500 MG capsule  Take 1 capsule (500 mg total) by mouth 2 (two) times daily.  14 capsule  0    No Known Allergies  ROS: Pertinent items in HPI. Negative for fever, chills, syncope, urinary complaints, GI complaints, flank pain, passage of tissue.  OBJECTIVE  Blood pressure 106/90, pulse 86, temperature 98.1 F (36.7 C), temperature source Oral, resp. rate 18, height 5' 3.5" (1.613 m), weight 106.595 kg (235 lb), last menstrual period  04/10/2013.  GENERAL: Well-developed, well-nourished female in moderate distress.  HEENT: Normocephalic  HEART: normal rate  RESP: normal effort  ABDOMEN: Soft, mild, bilateral low abdominal tenderness. Negative rebound or mass, but exam limited by body habitus. Negative CVA tenderness.  EXTREMITIES: Nontender, no edema  NEURO: Alert and oriented  SPECULUM EXAM: NEFG, small amount of bright red blood noted, cervix clean.  BIMANUAL: cervix fingertip; unable to assess uterus size due to body habitus,? Slightly enlarged. Tender mass in anterior fornix. No adnexal tenderness or masses. Mild cervical motion tenderness.  LAB RESULTS  Results for orders placed during the hospital encounter of 09/07/13 (from the past 24 hour(s))   URINALYSIS, ROUTINE W REFLEX MICROSCOPIC Status: Abnormal    Collection Time    09/07/13 2:49 PM   Result  Value  Ref Range    Color, Urine  AMBER (*)  YELLOW    APPearance  HAZY (*)  CLEAR    Specific Gravity, Urine  >1.030 (*)  1.005 - 1.030    pH  6.0  5.0 - 8.0    Glucose, UA  NEGATIVE  NEGATIVE mg/dL    Hgb urine dipstick  LARGE (*)  NEGATIVE    Bilirubin Urine  NEGATIVE  NEGATIVE    Ketones, ur  NEGATIVE  NEGATIVE mg/dL    Protein, ur  30 (*)  NEGATIVE mg/dL    Urobilinogen, UA  0.2  0.0 - 1.0 mg/dL    Nitrite  NEGATIVE  NEGATIVE    Leukocytes, UA  NEGATIVE  NEGATIVE   URINE MICROSCOPIC-ADD ON Status: Abnormal    Collection Time    09/07/13 2:49 PM   Result  Value  Ref Range    Squamous Epithelial / LPF  FEW (*)  RARE    WBC, UA  3-6  <3 WBC/hpf    RBC / HPF  TOO NUMEROUS TO COUNT  <3 RBC/hpf    Urine-Other  MUCOUS PRESENT    POCT PREGNANCY, URINE Status:  None    Collection Time    09/07/13 2:55 PM   Result  Value  Ref Range    Preg Test, Ur  NEGATIVE  NEGATIVE   HCG, QUANTITATIVE, PREGNANCY Status: Abnormal    Collection Time    09/07/13 3:15 PM   Result  Value  Ref Range    hCG, Beta Chain, Quant, S  14 (*)  <5 mIU/mL   CBC Status: None     Collection Time    09/07/13 3:15 PM   Result  Value  Ref Range    WBC  8.1  4.0 - 10.5 K/uL    RBC  4.72  3.87 - 5.11 MIL/uL    Hemoglobin  13.9  12.0 - 15.0 g/dL    HCT  40.9  36.0 - 46.0 %    MCV  86.7  78.0 - 100.0 fL    MCH  29.4  26.0 - 34.0 pg    MCHC  34.0  30.0 - 36.0 g/dL    RDW  13.7  11.5 - 15.5 %    Platelets  240  150 - 400 K/uL   TYPE AND SCREEN Status: None    Collection Time    09/07/13 3:15 PM   Result  Value  Ref Range    ABO/RH(D)  B POS     Antibody Screen  NEG     Sample Expiration  09/10/2013    WET PREP, GENITAL Status: Abnormal    Collection Time    09/07/13 3:20 PM   Result  Value  Ref Range    Yeast Wet Prep HPF POC  NONE SEEN  NONE SEEN    Trich, Wet Prep  NONE SEEN  NONE SEEN    Clue Cells Wet Prep HPF POC  NONE SEEN  NONE SEEN    WBC, Wet Prep HPF POC  RARE (*)  NONE SEEN    IMAGING  US Ob Comp Less 14 Wks  09/07/2013 CLINICAL DATA: Heavy vaginal bleeding. EXAM: OBSTETRIC <14 WK [place] AND TRANSVAGINAL OB US TECHNIQUE: Both transabdominal and transvaginal ultrasound examinations were performed for complete evaluation of the gestation as well as the maternal uterus, adnexal regions, and pelvic cul-de-sac. Transvaginal technique was performed to assess early pregnancy. COMPARISON: 07/02/2013 FINDINGS: Intrauterine gestational sac: Large irregular fluid-filled intrauterine cavity. Yolk sac: None Embryo: None Cardiac Activity: N/A Heart Rate: N/A bpm Cystic intrauterine cavity measures 7.6 x 4.4 cm. This could be a dilated endometrial cavity with thick irregular components. Could not exclude the possibility of retained products of gestation and possible endometrial or cervical stenosis. Maternal uterus/adnexae: The left ovary is normal. The right ovary contains a hemorrhagic cyst. No free pelvic fluid collections. IMPRESSION: Large cystic intrauterine cavity with thick irregular components. Could not exclude retained products of gestation. No intrauterine gestational  sac or fetus. Hemorrhagic cyst associated with the right ovary. Electronically Signed By: Kalman Jewels M.D. On: 09/07/2013 16:34  US Ob Transvaginal  09/07/2013 CLINICAL DATA: Heavy vaginal bleeding. EXAM: OBSTETRIC <14 WK [place] AND TRANSVAGINAL OB US TECHNIQUE: Both transabdominal and transvaginal ultrasound examinations were performed for complete evaluation of the gestation as well as the maternal uterus, adnexal regions, and pelvic cul-de-sac. Transvaginal technique was performed to assess early pregnancy. COMPARISON: 07/02/2013 FINDINGS: Intrauterine gestational sac: Large irregular fluid-filled intrauterine cavity. Yolk sac: None Embryo: None Cardiac Activity: N/A Heart Rate: N/A bpm Cystic intrauterine cavity measures 7.6 x 4.4 cm. This could be a dilated endometrial cavity with thick irregular components. Could not  exclude the possibility of retained products of gestation and possible endometrial or cervical stenosis. Maternal uterus/adnexae: The left ovary is normal. The right ovary contains a hemorrhagic cyst. No free pelvic fluid collections. IMPRESSION: Large cystic intrauterine cavity with thick irregular components. Could not exclude retained products of gestation. No intrauterine gestational sac or fetus. Hemorrhagic cyst associated with the right ovary. Electronically Signed By: Kalman Jewels M.D. On: 09/07/2013 16:34   MAU COURSE  CBC, type and screen and hCG ordered.  Requesting pain meds. Dilaudid ordered.  Pain improved. Patient now with dry heaving. Requesting medication for nausea. Zofran ordered. Reviewed results of lab work and ultrasound with Dr. Gala Romney. Recommends D&C. Patient notified. Agrees to proceed. Last solids and liquids at approximately noon. Vomited afterward.   ASSESSMENT  1.  Incomplete abortion with delayed or excessive hemorrhage- hemodynamically stable     PLAN  D&C  Prep for OR  N.p.o.  Manya Silvas, CNM  09/07/2013  5:25 PM    Attestation of Attending  Supervision of Advanced Practitioner (CNM/NP): Evaluation and management procedures were performed by the Advanced Practitioner under my supervision and collaboration. I have reviewed the Advanced Practitioner's note and chart, and I agree with the management and plan.  Pt seen and examined.  Patient was counseled regarding need for D&E. Risks of surgery including bleeding, infection, injury to surrounding organs, need for additional procedures, possibility of intrauterine scarring which may impair future fertility, risk of retained products which may require further management and other postoperative/anesthesia complications were explained to patient. Likelihood of success of complete evacuation of the uterus was discussed with the patient. Written informed consent was obtained. Anesthesia and OR aware. Preoperative prophylactic Doxycycline 100mg  IV has been ordered and is on call to the OR. To OR when ready.  Guss Bunde  6:50 PM

## 2013-09-07 NOTE — MAU Note (Addendum)
Patient presents with the complaint that she woke up in a puddle of blood followed by pain in her lower abdomen. States she had a miscarriage at 61 weeks in February.

## 2013-09-07 NOTE — Op Note (Signed)
Beverly Martinez PROCEDURE DATE: 09/07/2013  PREOPERATIVE DIAGNOSIS: 38 yo [id number] with blighted ovum.  POSTOPERATIVE DIAGNOSIS: The same.  PROCEDURE:     Dilation and Evacuation.  SURGEON:  Dr. Silas [place]  INDICATIONS: 38 y.o. [id number] MAB / blighted ovum, needing surgical completion.  Risks of surgery were discussed with the patient including but not limited to: bleeding which may require transfusion; infection which may require antibiotics; injury to uterus or surrounding organs;need for additional procedures including laparotomy or laparoscopy; possibility of intrauterine scarring which may impair future fertility; and other postoperative/anesthesia complications. Written informed consent was obtained.    FINDINGS:  A 6 size anteverted/midline/retroverted uterus, moderate amounts of products of conception, specimen sent to pathology.  ANESTHESIA:    Monitored intravenous sedation, paracervical block.  ESTIMATED BLOOD LOSS:  Less than 20 ml.  SPECIMENS:  Products of conception sent to pathology  COMPLICATIONS:  None immediate.  PROCEDURE DETAILS:  The patient received intravenous antibiotics while in the preoperative area.  She was then taken to the operating room where monitored intravenous sedation was administered and was found to be adequate.  After an adequate timeout was performed, she was placed in the dorsal lithotomy position and examined; then prepped and draped in the sterile manner.   Her bladder was catheterized for an unmeasured amount of clear, yellow urine. A vaginal speculum was then placed in the patient's vagina and a single tooth tenaculum was applied to the anterior lip of the cervix.  A paracervical block using 20 cc of 1 percent lidocaine was administered. The cervix was gently dilated to accommodate a 8.5 mm suction curette that was gently advanced to the uterine fundus.  The suction device was then activated and curette slowly rotated to clear the uterus of  products of conception.  A sharp curettage was then performed to confirm complete emptying of the uterus. There was minimal bleeding noted and the tenaculum removed with good hemostasis noted.   All instruments were removed from the patient's vagina. The patient tolerated the procedure well and was taken to the recovery area awake, and in stable condition.  The patient will be discharged to home as per PACU criteria.  Routine postoperative instructions given.  She was prescribed Percocet and Ibuprofen.  She will follow up in the clinic in 2-3 weeks for postoperative evaluation.

## 2013-09-07 NOTE — MAU Provider Note (Signed)
Chief Complaint: Abdominal Pain and Vaginal Bleeding   First Provider Initiated Contact with Patient 09/07/13 1510     SUBJECTIVE HPI: Beverly Martinez is a 38 y.o. [id number] at 2+ months S/P presumed completed SAB who presents by EMS with waking up from a nap this afternoon "in a puddle of blood" followed by severe low abdominal cramping. Diagnosed with 6 week intrauterine pregnancy in January 2015. Seen in maternity admissions 07/02/2013 and diagnosed with missed AB. Fetal pole measured 6 weeks, no cardiac activity, see additional findings below. Received Cytotec. Bled a moderate amount x 4 days. No bleeding or abdominal pain since then. Did not keep appointment for follow up visit with GYN clinic after SAB.  CLINICAL DATA: No fetal heart tones. Pelvic pain  EXAM:  OBSTETRIC <14 WK ULTRASOUND  TECHNIQUE:  Transabdominal ultrasound was performed for evaluation of the  gestation as well as the maternal uterus and adnexal regions.  COMPARISON: 05/22/2013  FINDINGS:  There is a large, bizarre appearing gestational sac which is  deformed by an eccentric 3cm heterogeneous, but predominant  hyperechoic mass which is nonvascular. This has the appearance of a  chorionic bump. There was no uterine mass seen on previous imaging.  A small fetus is suspected at the lower margin of the gestational  sac, measuring only 6 mm in length. No fetal cardiac activity. There  is no normal 11 week 6 day fetus (which would be expected based on  previous sonographic dating). There is no evidence of adnexal mass  or ovarian enlargement. No free pelvic fluid.  IMPRESSION:  1. Findings meet definitive criteria for failed pregnancy. This  follows SRU consensus guidelines: Diagnostic Criteria for Nonviable  Pregnancy Early in the First Trimester. N Engl J Med  2013;369:1443-51.  2. 3 cm gestational mass that is most consistent with hematoma.   Past Medical History  Diagnosis Date  . Pseudotumor cerebri   . Pregnancy  induced hypertension   . Infection     UTI  . Abnormal Pap smear    OB History  Gravida Para Term Preterm AB SAB TAB Ectopic Multiple Living  6 2  2 3 1 2   2     # Outcome Date GA Lbr Len/2nd Weight Sex Delivery Anes PTL Lv  6 PRE      CS        Comments: pre-eclampsia  5 PRE  [redacted]w[redacted]d    CS        Comments: pre- eclampsia  4 TAB           3 TAB           2 SAB           1 GRA              Comments: System Generated. Please review and update pregnancy details.     Past Surgical History  Procedure Laterality Date  . Cesarean section    . Colposcopy    . Induced abortion    . Wisdom tooth extraction    . Lumbar puncture     History   Social History  . Marital Status: Single    Spouse Name: N/A    Number of Children: N/A  . Years of Education: N/A   Occupational History  . Not on file.   Social History Main Topics  . Smoking status: Current Every Day Smoker    Types: Cigarettes  . Smokeless tobacco: Current User  . Alcohol Use: Yes  Comment: occasional  . Drug Use: No  . Sexual Activity: Yes    Birth Control/ Protection: Condom   Other Topics Concern  . Not on file   Social History Narrative  . No narrative on file   No current facility-administered medications on file prior to encounter.   Current Outpatient Prescriptions on File Prior to Encounter  Medication Sig Dispense Refill  . cephALEXin (KEFLEX) 500 MG capsule Take 1 capsule (500 mg total) by mouth 2 (two) times daily.  14 capsule  0   No Known Allergies  ROS: Pertinent items in HPI. Negative for fever, chills, syncope, urinary complaints, GI complaints, flank pain, passage of tissue.  OBJECTIVE Blood pressure 106/90, pulse 86, temperature 98.1 F (36.7 C), temperature source Oral, resp. rate 18, height 5' 3.5" (1.613 m), weight 106.595 kg (235 lb), last menstrual period 04/10/2013. GENERAL: Well-developed, well-nourished female in moderate distress.  HEENT: Normocephalic HEART: normal  rate RESP: normal effort ABDOMEN: Soft, mild, bilateral low abdominal tenderness. Negative rebound or mass, but exam limited by body habitus. Negative CVA tenderness. EXTREMITIES: Nontender, no edema NEURO: Alert and oriented SPECULUM EXAM: NEFG, small amount of bright red blood noted, cervix clean.  BIMANUAL: cervix fingertip; unable to assess uterus size due to body habitus,? Slightly enlarged. Tender mass in anterior fornix.  No adnexal tenderness or masses. Mild cervical motion tenderness.  LAB RESULTS Results for orders placed during the hospital encounter of 09/07/13 (from the past 24 hour(s))  URINALYSIS, ROUTINE W REFLEX MICROSCOPIC     Status: Abnormal   Collection Time    09/07/13  2:49 PM      Result Value Ref Range   Color, Urine AMBER (*) YELLOW   APPearance HAZY (*) CLEAR   Specific Gravity, Urine >1.030 (*) 1.005 - 1.030   pH 6.0  5.0 - 8.0   Glucose, UA NEGATIVE  NEGATIVE mg/dL   Hgb urine dipstick LARGE (*) NEGATIVE   Bilirubin Urine NEGATIVE  NEGATIVE   Ketones, ur NEGATIVE  NEGATIVE mg/dL   Protein, ur 30 (*) NEGATIVE mg/dL   Urobilinogen, UA 0.2  0.0 - 1.0 mg/dL   Nitrite NEGATIVE  NEGATIVE   Leukocytes, UA NEGATIVE  NEGATIVE  URINE MICROSCOPIC-ADD ON     Status: Abnormal   Collection Time    09/07/13  2:49 PM      Result Value Ref Range   Squamous Epithelial / LPF FEW (*) RARE   WBC, UA 3-6  <3 WBC/hpf   RBC / HPF TOO NUMEROUS TO COUNT  <3 RBC/hpf   Urine-Other MUCOUS PRESENT    POCT PREGNANCY, URINE     Status: None   Collection Time    09/07/13  2:55 PM      Result Value Ref Range   Preg Test, Ur NEGATIVE  NEGATIVE  HCG, QUANTITATIVE, PREGNANCY     Status: Abnormal   Collection Time    09/07/13  3:15 PM      Result Value Ref Range   hCG, Beta Chain, Quant, S 14 (*) <5 mIU/mL  CBC     Status: None   Collection Time    09/07/13  3:15 PM      Result Value Ref Range   WBC 8.1  4.0 - 10.5 K/uL   RBC 4.72  3.87 - 5.11 MIL/uL   Hemoglobin 13.9  12.0 -  15.0 g/dL   HCT 40.9  36.0 - 46.0 %   MCV 86.7  78.0 - 100.0 fL   MCH 29.4  26.0 - 34.0 pg   MCHC 34.0  30.0 - 36.0 g/dL   RDW 13.7  11.5 - 15.5 %   Platelets 240  150 - 400 K/uL  TYPE AND SCREEN     Status: None   Collection Time    09/07/13  3:15 PM      Result Value Ref Range   ABO/RH(D) B POS     Antibody Screen NEG     Sample Expiration 09/10/2013    WET PREP, GENITAL     Status: Abnormal   Collection Time    09/07/13  3:20 PM      Result Value Ref Range   Yeast Wet Prep HPF POC NONE SEEN  NONE SEEN   Trich, Wet Prep NONE SEEN  NONE SEEN   Clue Cells Wet Prep HPF POC NONE SEEN  NONE SEEN   WBC, Wet Prep HPF POC RARE (*) NONE SEEN    IMAGING US Ob Comp Less 14 Wks  09/07/2013   CLINICAL DATA:  Heavy vaginal bleeding.  EXAM: OBSTETRIC <14 WK [place] AND TRANSVAGINAL OB US  TECHNIQUE: Both transabdominal and transvaginal ultrasound examinations were performed for complete evaluation of the gestation as well as the maternal uterus, adnexal regions, and pelvic cul-de-sac. Transvaginal technique was performed to assess early pregnancy.  COMPARISON:  07/02/2013  FINDINGS: Intrauterine gestational sac: Large irregular fluid-filled intrauterine cavity.  Yolk sac:  None  Embryo:  None  Cardiac Activity: N/A  Heart Rate:  N/A bpm  Cystic intrauterine cavity measures 7.6 x 4.4 cm. This could be a dilated endometrial cavity with thick irregular components. Could not exclude the possibility of retained products of gestation and possible endometrial or cervical stenosis.  Maternal uterus/adnexae:  The left ovary is normal. The right ovary contains a hemorrhagic cyst.  No free pelvic fluid collections.  IMPRESSION: Large cystic intrauterine cavity with thick irregular components. Could not exclude retained products of gestation.  No intrauterine gestational sac or fetus.  Hemorrhagic cyst associated with the right ovary.   Electronically Signed   By: Kalman Jewels M.D.   On: 09/07/2013 16:34   US Ob  Transvaginal  09/07/2013   CLINICAL DATA:  Heavy vaginal bleeding.  EXAM: OBSTETRIC <14 WK [place] AND TRANSVAGINAL OB US  TECHNIQUE: Both transabdominal and transvaginal ultrasound examinations were performed for complete evaluation of the gestation as well as the maternal uterus, adnexal regions, and pelvic cul-de-sac. Transvaginal technique was performed to assess early pregnancy.  COMPARISON:  07/02/2013  FINDINGS: Intrauterine gestational sac: Large irregular fluid-filled intrauterine cavity.  Yolk sac:  None  Embryo:  None  Cardiac Activity: N/A  Heart Rate:  N/A bpm  Cystic intrauterine cavity measures 7.6 x 4.4 cm. This could be a dilated endometrial cavity with thick irregular components. Could not exclude the possibility of retained products of gestation and possible endometrial or cervical stenosis.  Maternal uterus/adnexae:  The left ovary is normal. The right ovary contains a hemorrhagic cyst.  No free pelvic fluid collections.  IMPRESSION: Large cystic intrauterine cavity with thick irregular components. Could not exclude retained products of gestation.  No intrauterine gestational sac or fetus.  Hemorrhagic cyst associated with the right ovary.   Electronically Signed   By: Kalman Jewels M.D.   On: 09/07/2013 16:34   MAU COURSE CBC, type and screen and hCG ordered.  Requesting pain meds. Dilaudid ordered.  Pain improved. Patient now with dry heaving. Requesting medication for nausea. Zofran ordered. Reviewed results of lab work and ultrasound  with Dr. Gala Romney. Recommends D&C. Patient notified. Agrees to proceed. Last solids and liquids at approximately noon. Vomited afterward.  ASSESSMENT 1. Incomplete abortion with delayed or excessive hemorrhage- hemodynamically stable   PLAN D&C Prep for OR N.p.o.  Manya Silvas, CNM 09/07/2013  5:25 PM    Attestation of Attending Supervision of Advanced Practitioner (CNM/NP): Evaluation and management procedures were performed by the Advanced  Practitioner under my supervision and collaboration. I have reviewed the Advanced Practitioner's note and chart, and I agree with the management and plan.  Pt seen and examined. Patient was counseled regarding need for  D&E.  Risks of surgery including bleeding, infection, injury to surrounding organs, need for additional procedures, possibility of intrauterine scarring which may impair future fertility, risk of retained products which may require further management and other postoperative/anesthesia complications were explained to patient.  Likelihood of success of complete evacuation of the uterus was discussed with the patient.  Written informed consent was obtained. Anesthesia and OR aware.  Preoperative prophylactic Doxycycline 100mg  IV  has been ordered and is on call to the OR.  To OR when ready.    Guss Bunde 6:50 PM

## 2013-09-08 ENCOUNTER — Encounter (HOSPITAL_COMMUNITY): Payer: Self-pay | Admitting: Obstetrics & Gynecology

## 2013-09-08 LAB — GC/CHLAMYDIA PROBE AMP
CT Probe RNA: NEGATIVE
GC Probe RNA: NEGATIVE

## 2013-10-01 ENCOUNTER — Encounter: Payer: Self-pay | Admitting: General Practice

## 2014-03-09 ENCOUNTER — Encounter (HOSPITAL_COMMUNITY): Payer: Self-pay | Admitting: Obstetrics & Gynecology

## 2014-09-01 ENCOUNTER — Ambulatory Visit: Payer: Medicaid Other | Admitting: Neurology

## 2014-09-02 ENCOUNTER — Encounter: Payer: Self-pay | Admitting: Neurology

## 2014-10-27 ENCOUNTER — Encounter: Payer: Self-pay | Admitting: Neurology

## 2014-10-27 ENCOUNTER — Ambulatory Visit (INDEPENDENT_AMBULATORY_CARE_PROVIDER_SITE_OTHER): Payer: Medicaid Other | Admitting: Neurology

## 2014-10-27 VITALS — BP 166/102 | HR 88 | Resp 22 | Ht 63.5 in | Wt 252.2 lb

## 2014-10-27 DIAGNOSIS — Z720 Tobacco use: Secondary | ICD-10-CM | POA: Diagnosis not present

## 2014-10-27 DIAGNOSIS — R030 Elevated blood-pressure reading, without diagnosis of hypertension: Secondary | ICD-10-CM

## 2014-10-27 DIAGNOSIS — G932 Benign intracranial hypertension: Secondary | ICD-10-CM | POA: Diagnosis not present

## 2014-10-27 DIAGNOSIS — R200 Anesthesia of skin: Secondary | ICD-10-CM

## 2014-10-27 DIAGNOSIS — IMO0001 Reserved for inherently not codable concepts without codable children: Secondary | ICD-10-CM | POA: Insufficient documentation

## 2014-10-27 NOTE — Patient Instructions (Addendum)
Continue acetazolamide (Diamox) 500mg  twice daily for now.  Follow up with Dr. Venetia Maxon in one month for repeat eye exam to determine improvement or need to increase dose.  If still eye swelling, then would do spinal tap MRI of brain with aFnd without contrast  [facility]  11/04/14 10:45am  Weight loss important.  Will refer to dietician.  Exercise Low sodium diet Follow up in 6-8 weeks. STOP smoking

## 2014-10-27 NOTE — Progress Notes (Addendum)
NEUROLOGY CONSULTATION NOTE  Beverly Martinez MRN: [id number] DOB: 1976/01/11  Referring provider: Dr. Jeanie Cooks Primary care provider: Dr. Jeanie Cooks  Reason for consult:  IIH  HISTORY OF PRESENT ILLNESS: Beverly Martinez is a 39 year old left-handed woman with tobacco use who presents for idiopathic intracranial hypertension.  One record from previous neurologist reviewed.  She was diagnosed with IIH in 1998 after having gained excessive weight due to pregnancy.  She was seen and treated by neurology over 10 years ago and reportedly she had been non-compliant with her medication as well as with scheduled follow-ups.  She was taking acetazolamide but stopped for several years up until 4 months ago.  At that time, she decided to have a routine eye exam with Dr. Venetia Maxon.  Exam showed some mild papilledema but nothing too severe.  She restarted acetazolamide, but has not been taking it correctly (skipping doses and taking it every other day).  She has been unsuccessful in weight loss.  Since restarting acetazolamide, she reports headaches have improved.  They are non-throbbing bi-frontal/temporal headaches and occur maybe twice a month.  She denies visual obscurations.  Occasionally, she notes pulsatile tinnitus.  More recently, she reports numbness involving her tongue.  PAST MEDICAL HISTORY: Past Medical History  Diagnosis Date  . Pseudotumor cerebri   . Pregnancy induced hypertension   . Infection     UTI  . Abnormal Pap smear     PAST SURGICAL HISTORY: Past Surgical History  Procedure Laterality Date  . Cesarean section    . Colposcopy    . Induced abortion    . Wisdom tooth extraction    . Lumbar puncture    . Dilation and evacuation N/A 09/07/2013    Procedure: DILATATION AND EVACUATION;  Surgeon: Guss Bunde, MD;  Location: [facility];  Service: Gynecology;  Laterality: N/A;    MEDICATIONS: Current Outpatient Prescriptions on File Prior to Visit  Medication Sig Dispense Refill  .  ibuprofen (ADVIL,MOTRIN) 800 MG tablet Take 1 tablet (800 mg total) by mouth 3 (three) times daily. 21 tablet 0   No current facility-administered medications on file prior to visit.    ALLERGIES: No Known Allergies  FAMILY HISTORY: Family History  Problem Relation Age of Onset  . Hypertension Mother   . Cancer Maternal Grandmother     pancreatic    SOCIAL HISTORY: History   Social History  . Marital Status: Single    Spouse Name: N/A  . Number of Children: N/A  . Years of Education: N/A   Occupational History  . Not on file.   Social History Main Topics  . Smoking status: Current Every Day Smoker    Types: Cigarettes  . Smokeless tobacco: Never Used  . Alcohol Use: 0.0 oz/week    0 Standard drinks or equivalent per week     Comment: occasional  . Drug Use: No  . Sexual Activity:    Partners: Male    Birth Control/ Protection: Condom   Other Topics Concern  . Not on file   Social History Narrative    REVIEW OF SYSTEMS: Constitutional: No fevers, chills, or sweats, no generalized fatigue, change in appetite Eyes: No visual changes, double vision, eye pain Ear, nose and throat: No hearing loss, ear pain, nasal congestion, sore throat Cardiovascular: No chest pain, palpitations Respiratory:  No shortness of breath at rest or with exertion, wheezes GastrointestinaI: No nausea, vomiting, diarrhea, abdominal pain, fecal incontinence Genitourinary:  No dysuria, urinary retention or frequency Musculoskeletal:  No neck pain, back pain Integumentary: No rash, pruritus, skin lesions Neurological: as above Psychiatric: No depression, insomnia, anxiety Endocrine: No palpitations, fatigue, diaphoresis, mood swings, change in appetite, change in weight, increased thirst Hematologic/Lymphatic:  No anemia, purpura, petechiae. Allergic/Immunologic: no itchy/runny eyes, nasal congestion, recent allergic reactions, rashes  PHYSICAL EXAM: Filed Vitals:   10/27/14 1105  BP:  166/102  Pulse: 88  Resp: 22   General: No acute distress Head:  Normocephalic/atraumatic Eyes:  Unable to visualize fundi on inspection Neck: supple, no paraspinal tenderness, full range of motion Back: No paraspinal tenderness Heart: regular rate and rhythm Lungs: Clear to auscultation bilaterally. Vascular: No carotid bruits. Neurological Exam: Mental status: alert and oriented to person, place, and time, recent and remote memory intact, fund of knowledge intact, attention and concentration intact, speech fluent and not dysarthric, language intact. Cranial nerves: CN I: not tested CN II: pupils equal, round and reactive to light, visual fields intact CN III, IV, VI:  full range of motion, no nystagmus, no ptosis CN V: facial sensation intact CN VII: upper and lower face symmetric CN VIII: hearing intact CN IX, X: gag intact, uvula midline CN XI: sternocleidomastoid and trapezius muscles intact CN XII: tongue midline Bulk & Tone: normal, no fasciculations. Motor:  5/5 throughout Sensation:  Temperature and vibration intact Deep Tendon Reflexes:  2+ throughout, toes downgoing Finger to nose testing:  No dysmetria Heel to shin:  intact Gait:  Normal station and stride.  Able to tandem walk. Romberg negative.  IMPRESSION: Idiopathic intracranial hypertension Tension-type headaches Tobacco abuse  Morbid obesity Elevated blood pressure   PLAN: 1.  Start acetazolamide 500mg  twice daily as prescribed 2.  Follow up with Dr. Venetia Maxon in one month for recheck.  If papilledema still present, would proceed with LP 3.  MRI of brain with and without contrast 4.  Weight loss and exercise- send to dietician 5.  Low sodium diet 6.  Follow up in 8 weeks. 7.  Will get notes from ophtho and other neurologist 8.  Follow up with PCP regarding blood pressure recheck 9.  Smoking cessation  45 minutes spent face to face with patient, over 50% spent discussing plan.  Thank you for  allowing me to take part in the care of this patient.  Metta Clines, DO  CC: Nolene Ebbs, MD

## 2014-11-04 ENCOUNTER — Ambulatory Visit (HOSPITAL_COMMUNITY): Admission: RE | Admit: 2014-11-04 | Payer: Medicaid Other | Source: Ambulatory Visit

## 2014-11-12 ENCOUNTER — Telehealth: Payer: Self-pay | Admitting: Neurology

## 2014-11-12 NOTE — Telephone Encounter (Signed)
Referral is made to nutrition

## 2014-11-12 NOTE — Telephone Encounter (Signed)
Pt Beverly Martinez DOB: [birth year] called about info of a Dietitian call back @ [phone]

## 2014-11-16 ENCOUNTER — Ambulatory Visit (HOSPITAL_COMMUNITY)
Admission: RE | Admit: 2014-11-16 | Discharge: 2014-11-16 | Disposition: A | Discharge status: Home / Self Care | Payer: Medicaid Other | Source: Ambulatory Visit | Attending: Neurology | Admitting: Neurology

## 2014-11-16 DIAGNOSIS — R200 Anesthesia of skin: Secondary | ICD-10-CM

## 2014-11-16 DIAGNOSIS — G932 Benign intracranial hypertension: Secondary | ICD-10-CM | POA: Diagnosis present

## 2014-11-16 MED ORDER — GADOBENATE DIMEGLUMINE 529 MG/ML IV SOLN
20.0000 mL | Freq: Once | INTRAVENOUS | Status: AC | PRN
  Administered 2014-11-16: 20 mL via INTRAVENOUS

## 2014-11-17 ENCOUNTER — Telehealth: Payer: Self-pay | Admitting: *Deleted

## 2014-11-17 NOTE — Telephone Encounter (Signed)
Patient is aware that   MRI is normal

## 2014-11-17 NOTE — Telephone Encounter (Signed)
-----   Message from Pieter Partridge, DO sent at 11/17/2014  7:13 AM EDT ----- MRI is normal

## 2014-11-30 ENCOUNTER — Telehealth: Payer: Self-pay | Admitting: Neurology

## 2014-11-30 NOTE — Telephone Encounter (Signed)
Pt called and stated she was supposed to be referred to a nutritionist and has not heard anything yet/Dawn CB# [phone]

## 2014-11-30 NOTE — Telephone Encounter (Signed)
Diabetes and nutrition will be calling patient today for appt

## 2014-12-22 ENCOUNTER — Telehealth: Payer: Self-pay | Admitting: *Deleted

## 2014-12-22 NOTE — Telephone Encounter (Signed)
-----   Message from Pieter Partridge, DO sent at 12/22/2014  7:49 AM EDT ----- I am seeing this patient in follow up for pseudotumor.  She was supposed to see her eye doctor again in late July prior to follow up.  Did she have this done and if so, can we get it before Friday?  I am seeing her Friday.

## 2014-12-22 NOTE — Telephone Encounter (Signed)
I called patient and left message for her to return my call regarding Eye doctor appt.

## 2014-12-25 ENCOUNTER — Ambulatory Visit: Payer: Medicaid Other | Admitting: Neurology

## 2014-12-31 ENCOUNTER — Ambulatory Visit: Payer: Medicaid Other | Admitting: Dietician

## 2015-01-05 ENCOUNTER — Encounter: Payer: Self-pay | Admitting: Dietician

## 2015-01-05 ENCOUNTER — Encounter: Payer: Medicaid Other | Attending: Internal Medicine | Admitting: Dietician

## 2015-01-05 DIAGNOSIS — Z713 Dietary counseling and surveillance: Secondary | ICD-10-CM | POA: Diagnosis not present

## 2015-01-05 DIAGNOSIS — Z6841 Body Mass Index (BMI) 40.0 and over, adult: Secondary | ICD-10-CM | POA: Insufficient documentation

## 2015-01-05 NOTE — Patient Instructions (Signed)
[facility] [facility] Mobile crisis management Crisis Line:  [phone]. Recommend counseling. To enquire further about Bariatric Surgery call [phone] to register for an information class.  Or visit [email or web address]. Consider exercise as often as possible.  Aim for 30 minutes 5-6 days per week.  Increase this as you are able. Put on some music! Exercise can increase your energy and help you lose weight. Consider not drinking calories. Eat breakfast, lunch and dinner. Avoid adding extra salt.

## 2015-01-05 NOTE — Progress Notes (Signed)
  Medical Nutrition Therapy:  Appt start time: 1660 end time:  1645.   Assessment:  Primary concerns today: patient is here alone.  She would like to lose weight to help treat her idiopathic intracranial HTN.  During the visit, patient was on the phone often texting and answering calls.  She reported having increased anxiety, scored 22 on the depression screen today.  Difficult to get her to focus.  She was very verbal and hyper.  She reports increased eating with stress.  Skips meals at times.  Reports increased stress with work. 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 Recommended counseling and gave the emergency mental health number.  She lives with her 38 yo and 94 yo daughter and 21 yo niece.  She does the shopping and the cooking but most often eats out (fast food).  She adds increased amounts of butter and other fats.  She said that she wants bariatric surgery but will try to modify behaviors first.  She has been on an appetite stimulant in the past but did not follow through with this.  TANITA  BODY COMP RESULTS 01/05/15 251.5 lbs   BMI (kg/m^2) 43.9   Fat Mass (lbs) 134   Fat Free Mass (lbs) 117.5   Total Body Water (lbs) 86   Preferred Learning Style:   No preference indicated   Learning Readiness:   Contemplating  MEDICATIONS: see list   DIETARY INTAKE: Eat most meals out, fast food.  Occasionally eats but fried chicken, potatoes, a lot of butter and oils.  Usually not sweets.  Likes increased salty stuff.  24-hr recall:  B ( AM): grits, eggs, bacon, biscuit with butter, apple juice Snk ( AM): none  L ( PM): none "Now I'm starving." Snk ( PM): cheeseburger D ( PM): Pizza 3 slices  Snk ( PM): none Beverages: 6 bottles soda, seltzer water, juice, lemonade, tea   Usual physical activity: none.  Treadmill at home but does  not use this.  Estimated energy needs: 1900 calories 80 g protein   Progress Towards Goal(s):  In progress.   Nutritional Diagnosis:  NB-1.1 Food and nutrition-related knowledge deficit As related to balance of nutrition for exercise.  As evidenced by patient report and diet hx.    Intervention:  Nutrition education/counceling regarding weight loss.  [facility] Mobile crisis management Crisis Line:  [phone]. Recommend counseling. To enquire further about Bariatric Surgery call [phone] to register for an information class.  Or visit [email or web address]. Consider exercise as often as possible.  Aim for 30 minutes 5-6 days per week.  Increase this as you are able. Put on some music! Exercise can increase your energy and help you lose weight. Consider not drinking calories. Eat breakfast, lunch and dinner. Avoid adding extra salt.  Teaching Method Utilized:  Visual Auditory Hands on  Handouts given during visit include:  7 Principle of successful weight loss  My plate  Barriers to learning/adherence to lifestyle change: mental health  Demonstrated degree of understanding via:  Teach Back   Monitoring/Evaluation:  Dietary intake, exercise, and body weight in 1 month(s).

## 2015-01-18 ENCOUNTER — Telehealth: Payer: Self-pay | Admitting: Neurology

## 2015-01-18 ENCOUNTER — Ambulatory Visit: Payer: Medicaid Other | Admitting: Neurology

## 2015-01-18 ENCOUNTER — Encounter: Payer: Self-pay | Admitting: Neurology

## 2015-01-18 NOTE — Telephone Encounter (Signed)
01-18-15 patient was late for her appt and we were not able to see her today we had to resch dana

## 2015-02-04 ENCOUNTER — Ambulatory Visit: Payer: Medicaid Other | Admitting: Dietician

## 2015-02-10 ENCOUNTER — Telehealth: Payer: Self-pay | Admitting: *Deleted

## 2015-02-10 NOTE — Telephone Encounter (Signed)
Called patient to see if she has been to the eye doctor.  No answer and voicemail was full.  Called Dr. Gertie Exon office and left message for someone to call me back about getting patient's ov note.

## 2015-02-11 NOTE — Telephone Encounter (Signed)
Last office visit note received.

## 2015-02-12 ENCOUNTER — Ambulatory Visit: Payer: Medicaid Other | Admitting: Neurology

## 2015-02-17 ENCOUNTER — Ambulatory Visit: Payer: Medicaid Other | Admitting: Neurology

## 2015-03-04 ENCOUNTER — Ambulatory Visit: Payer: Medicaid Other | Admitting: Dietician

## 2015-03-12 ENCOUNTER — Ambulatory Visit: Payer: Medicaid Other | Admitting: Neurology

## 2015-03-28 ENCOUNTER — Encounter (HOSPITAL_COMMUNITY): Payer: Self-pay | Admitting: *Deleted

## 2015-03-28 ENCOUNTER — Inpatient Hospital Stay (HOSPITAL_COMMUNITY)
Admission: AD | Admit: 2015-03-28 | Discharge: 2015-03-28 | Disposition: A | Discharge status: Home / Self Care | Payer: Medicaid Other | Source: Ambulatory Visit | Attending: Obstetrics and Gynecology | Admitting: Obstetrics and Gynecology

## 2015-03-28 DIAGNOSIS — F17210 Nicotine dependence, cigarettes, uncomplicated: Secondary | ICD-10-CM | POA: Diagnosis not present

## 2015-03-28 DIAGNOSIS — N898 Other specified noninflammatory disorders of vagina: Secondary | ICD-10-CM | POA: Diagnosis present

## 2015-03-28 DIAGNOSIS — N760 Acute vaginitis: Secondary | ICD-10-CM | POA: Diagnosis not present

## 2015-03-28 DIAGNOSIS — A499 Bacterial infection, unspecified: Secondary | ICD-10-CM | POA: Diagnosis not present

## 2015-03-28 DIAGNOSIS — B9689 Other specified bacterial agents as the cause of diseases classified elsewhere: Secondary | ICD-10-CM

## 2015-03-28 LAB — WET PREP, GENITAL
Sperm: NONE SEEN
Trich, Wet Prep: NONE SEEN
YEAST WET PREP: NONE SEEN

## 2015-03-28 MED ORDER — METRONIDAZOLE 500 MG PO TABS: 500.0000 mg | ORAL_TABLET | Freq: Two times a day (BID) | ORAL | Status: DC

## 2015-03-28 NOTE — Discharge Instructions (Signed)

## 2015-03-28 NOTE — MAU Provider Note (Signed)
Chief Complaint: Vaginal Discharge   First Provider Initiated Contact with Patient 03/28/15 0751      SUBJECTIVE HPI: Beverly Martinez is a 39 y.o. [phone] who presents to maternity admissions reporting chronic bacterial vaginosis with malodorous discharge. She has seen Dr Ruthann Cancer in the past but would like to be seen in [facility].  She has tried Flagyl and Metrogel and symptoms improve but they return soon after.  She denies STI risks with one monogamous partner x 10 years.  She denies abdominal pain. She denies vaginal bleeding, vaginal itching/burning, urinary symptoms, h/a, dizziness, n/v, or fever/chills.     Vaginal Discharge The patient's primary symptoms include vaginal discharge. The patient's pertinent negatives include no pelvic pain. This is a chronic problem. The current episode started more than 1 year ago. The problem occurs intermittently. The problem has been waxing and waning. The patient is experiencing no pain. She is not pregnant. Pertinent negatives include no abdominal pain, back pain, chills, constipation, diarrhea, dysuria, fever, flank pain, frequency, headaches, nausea or vomiting. The vaginal discharge was clear, malodorous and thin. There has been no bleeding. The symptoms are aggravated by intercourse. Treatments tried: Flagyl, Metrogel. The treatment provided moderate relief. She is sexually active. No, her partner does not have an STD.    Past Medical History  Diagnosis Date  . Pseudotumor cerebri   . Pregnancy induced hypertension   . Infection     UTI  . Abnormal Pap smear    Past Surgical History  Procedure Laterality Date  . Cesarean section    . Colposcopy    . Induced abortion    . Wisdom tooth extraction    . Lumbar puncture    . Dilation and evacuation N/A 09/07/2013    Procedure: DILATATION AND EVACUATION;  Surgeon: Guss Bunde, MD;  Location: [facility];  Service: Gynecology;  Laterality: N/A;   Social History   Social History  . Marital Status:  Single    Spouse Name: N/A  . Number of Children: N/A  . Years of Education: N/A   Occupational History  . Not on file.   Social History Main Topics  . Smoking status: Current Every Day Smoker    Types: Cigarettes  . Smokeless tobacco: Never Used  . Alcohol Use: 0.0 oz/week    0 Standard drinks or equivalent per week     Comment: occasional  . Drug Use: No  . Sexual Activity:    Partners: Male    Birth Control/ Protection: Condom   Other Topics Concern  . Not on file   Social History Narrative   No current facility-administered medications on file prior to encounter.   Current Outpatient Prescriptions on File Prior to Encounter  Medication Sig Dispense Refill  . acetaZOLAMIDE (DIAMOX) 500 MG capsule Take 500 mg by mouth 2 (two) times daily.    Marland Kitchen ibuprofen (ADVIL,MOTRIN) 800 MG tablet Take 1 tablet (800 mg total) by mouth 3 (three) times daily. (Patient not taking: Reported on 03/28/2015) 21 tablet 0   No Known Allergies  ROS:  Review of Systems  Constitutional: Negative for fever, chills and fatigue.  HENT: Negative for sinus pressure.   Eyes: Negative for photophobia.  Respiratory: Negative for shortness of breath.   Cardiovascular: Negative for chest pain.  Gastrointestinal: Negative for nausea, vomiting, abdominal pain, diarrhea and constipation.  Genitourinary: Positive for vaginal discharge. Negative for dysuria, frequency, flank pain, vaginal bleeding, difficulty urinating, vaginal pain and pelvic pain.  Musculoskeletal: Negative for back pain  and neck pain.  Neurological: Negative for dizziness, weakness and headaches.  Psychiatric/Behavioral: Negative.      I have reviewed patient's Past Medical Hx, Surgical Hx, Family Hx, Social Hx, medications and allergies.   Physical Exam   Patient Vitals for the past 24 hrs:  BP Temp Pulse Resp Height Weight  03/28/15 0705 130/89 mmHg 97.4 F (36.3 C) 91 18 5' 3.5" (1.613 m) 247 lb (112.038 kg)   Constitutional:  Well-developed, well-nourished female in no acute distress.  Cardiovascular: normal rate Respiratory: normal effort GI: Abd soft, non-tender. Pos BS x 4 MS: Extremities nontender, no edema, normal ROM Neurologic: Alert and oriented x 4.  GU: Neg CVAT.  PELVIC EXAM: Cervix pink, visually closed, without lesion, small amount thin malodorous white discharge, vaginal walls and external genitalia normal   LAB RESULTS Results for orders placed or performed during the hospital encounter of 03/28/15 (from the past 24 hour(s))  Wet prep, genital     Status: Abnormal   Collection Time: 03/28/15  7:42 AM  Result Value Ref Range   Yeast Wet Prep HPF POC NONE SEEN NONE SEEN   Trich, Wet Prep NONE SEEN NONE SEEN   Clue Cells Wet Prep HPF POC PRESENT (A) NONE SEEN   WBC, Wet Prep HPF POC FEW (A) NONE SEEN   Sperm NONE SEEN        IMAGING No results found.  MAU Management/MDM: Ordered labs and reviewed results.  Discussed bacterial vaginosis, prevention of vaginal infection including use of oral probiotics/eating yogurt, wearing breathable cotton underwear, no douching or use of harsh soaps.  Plan to treat with PO Flagyl and f/u in [place].  Pt stable at time of discharge.  ASSESSMENT 1. BV (bacterial vaginosis)     PLAN Discharge home Flagyl 500 mg BID x 7 days Message sent to [facility] to make appt Return to MAU as needed for emergencies    Medication List    STOP taking these medications        ibuprofen 800 MG tablet  Commonly known as:  ADVIL,MOTRIN      TAKE these medications        acetaZOLAMIDE 500 MG capsule  Commonly known as:  DIAMOX  Take 500 mg by mouth 2 (two) times daily.     cetirizine 10 MG tablet  Commonly known as:  ZYRTEC  Take 10 mg by mouth daily as needed for allergies.     metroNIDAZOLE 500 MG tablet  Commonly known as:  FLAGYL  Take 1 tablet (500 mg total) by mouth 2 (two) times daily.       Follow-up Information    Follow up with [facility]   Specialty:  Obstetrics and Gynecology   Why:  The clinic will call you with appointment., Return to MAU as needed for emergencies   Contact information:   [place] [facility] [place] Certified Nurse-Midwife 03/28/2015  8:21 AM

## 2015-03-28 NOTE — MAU Note (Signed)
Having vag d/c which has been chronic and ongoing. I know it is BV. Used gel and then makes me itch. Want to know why i keep getting BV

## 2015-03-29 ENCOUNTER — Ambulatory Visit: Payer: Medicaid Other | Admitting: Neurology

## 2015-03-29 LAB — GC/CHLAMYDIA PROBE AMP (~~LOC~~) NOT AT ARMC
CHLAMYDIA, DNA PROBE: NEGATIVE
NEISSERIA GONORRHEA: NEGATIVE

## 2015-04-13 ENCOUNTER — Ambulatory Visit: Payer: Medicaid Other | Admitting: Dietician

## 2015-04-16 ENCOUNTER — Ambulatory Visit: Payer: Medicaid Other | Admitting: Neurology

## 2015-04-20 ENCOUNTER — Ambulatory Visit: Payer: Medicaid Other | Admitting: Neurology

## 2015-04-22 ENCOUNTER — Encounter (HOSPITAL_COMMUNITY): Payer: Self-pay | Admitting: *Deleted

## 2015-04-22 ENCOUNTER — Emergency Department (HOSPITAL_COMMUNITY)
Admission: EM | Admit: 2015-04-22 | Discharge: 2015-04-23 | Disposition: A | Discharge status: Home / Self Care | Payer: No Typology Code available for payment source | Attending: Emergency Medicine | Admitting: Emergency Medicine

## 2015-04-22 DIAGNOSIS — S29012A Strain of muscle and tendon of back wall of thorax, initial encounter: Secondary | ICD-10-CM | POA: Diagnosis not present

## 2015-04-22 DIAGNOSIS — Z8619 Personal history of other infectious and parasitic diseases: Secondary | ICD-10-CM | POA: Diagnosis not present

## 2015-04-22 DIAGNOSIS — F17210 Nicotine dependence, cigarettes, uncomplicated: Secondary | ICD-10-CM | POA: Insufficient documentation

## 2015-04-22 DIAGNOSIS — Z792 Long term (current) use of antibiotics: Secondary | ICD-10-CM | POA: Diagnosis not present

## 2015-04-22 DIAGNOSIS — Z8669 Personal history of other diseases of the nervous system and sense organs: Secondary | ICD-10-CM | POA: Insufficient documentation

## 2015-04-22 DIAGNOSIS — Y998 Other external cause status: Secondary | ICD-10-CM | POA: Diagnosis not present

## 2015-04-22 DIAGNOSIS — Z87440 Personal history of urinary (tract) infections: Secondary | ICD-10-CM | POA: Insufficient documentation

## 2015-04-22 DIAGNOSIS — Y92410 Unspecified street and highway as the place of occurrence of the external cause: Secondary | ICD-10-CM | POA: Diagnosis not present

## 2015-04-22 DIAGNOSIS — Y9389 Activity, other specified: Secondary | ICD-10-CM | POA: Diagnosis not present

## 2015-04-22 DIAGNOSIS — Z79899 Other long term (current) drug therapy: Secondary | ICD-10-CM | POA: Diagnosis not present

## 2015-04-22 DIAGNOSIS — S29002A Unspecified injury of muscle and tendon of back wall of thorax, initial encounter: Secondary | ICD-10-CM | POA: Diagnosis present

## 2015-04-22 DIAGNOSIS — T148XXA Other injury of unspecified body region, initial encounter: Secondary | ICD-10-CM
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
## 2015-04-23 ENCOUNTER — Encounter: Payer: Self-pay | Admitting: Neurology

## 2015-04-23 MED ORDER — METHOCARBAMOL 500 MG PO TABS: 500.0000 mg | ORAL_TABLET | Freq: Two times a day (BID) | ORAL | Status: DC

## 2015-04-23 MED ORDER — NAPROXEN 375 MG PO TABS
375.0000 mg | ORAL_TABLET | Freq: Three times a day (TID) | ORAL | Status: DC
Start: 2015-04-23 — End: 2016-05-30
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
## 2015-04-26 ENCOUNTER — Telehealth: Payer: Self-pay | Admitting: Neurology

## 2015-04-26 NOTE — Telephone Encounter (Signed)
Patient dismissed from [facility] by Metta Clines DO , effective April 23, 2015. Dismissal letter sent out by certified / registered mail.  DAJ  Received signed domestic return receipt verifying delivery of certified letter on April 30, 2015. Article number [id number] [place]

## 2015-05-06 ENCOUNTER — Encounter: Payer: Medicaid Other | Admitting: Family Medicine

## 2015-05-11 ENCOUNTER — Ambulatory Visit: Payer: Medicaid Other | Admitting: Dietician

## 2015-06-07 ENCOUNTER — Ambulatory Visit: Payer: Medicaid Other | Admitting: Neurology

## 2015-08-11 ENCOUNTER — Ambulatory Visit: Payer: Medicaid Other | Admitting: Neurology

## 2015-08-16 ENCOUNTER — Ambulatory Visit: Payer: Medicaid Other | Admitting: Neurology

## 2016-05-30 ENCOUNTER — Encounter: Payer: Self-pay | Admitting: Nurse Practitioner

## 2016-05-30 ENCOUNTER — Ambulatory Visit (INDEPENDENT_AMBULATORY_CARE_PROVIDER_SITE_OTHER): Payer: Self-pay | Admitting: Nurse Practitioner

## 2016-05-30 VITALS — BP 120/80 | HR 76 | Temp 99.0°F | Ht 64.5 in | Wt 245.2 lb

## 2016-05-30 DIAGNOSIS — Z021 Encounter for pre-employment examination: Secondary | ICD-10-CM

## 2016-05-30 DIAGNOSIS — B9689 Other specified bacterial agents as the cause of diseases classified elsewhere: Secondary | ICD-10-CM

## 2016-05-30 DIAGNOSIS — N760 Acute vaginitis: Secondary | ICD-10-CM

## 2016-05-30 LAB — GLUCOSE, POCT (MANUAL RESULT ENTRY): POC Glucose: 98 mg/dl (ref 70–99)

## 2016-05-30 MED ORDER — ACETAZOLAMIDE ER 500 MG PO CP12: 500.0000 mg | ORAL_CAPSULE | Freq: Two times a day (BID) | ORAL | 2 refills | Status: DC

## 2016-05-30 MED ORDER — METRONIDAZOLE 500 MG PO TABS: 500.0000 mg | ORAL_TABLET | Freq: Two times a day (BID) | ORAL | 2 refills | Status: DC

## 2016-05-30 MED ORDER — FLUTICASONE PROPIONATE 50 MCG/ACT NA SUSP: 2.0000 | Freq: Every day | NASAL | 0 refills | Status: DC

## 2016-05-30 NOTE — Patient Instructions (Addendum)
Bacterial Vaginosis Bacterial vaginosis is a vaginal infection that occurs when the normal balance of bacteria in the vagina is disrupted. It results from an overgrowth of certain bacteria. This is the most common vaginal infection among women ages 15-44. Because bacterial vaginosis increases your risk for STIs (sexually transmitted infections), getting treated can help reduce your risk for chlamydia, gonorrhea, herpes, and HIV (human immunodeficiency virus). Treatment is also important for preventing complications in pregnant women, because this condition can cause an early (premature) delivery. What are the causes? This condition is caused by an increase in harmful bacteria that are normally present in small amounts in the vagina. However, the reason that the condition develops is not fully understood. What increases the risk? The following factors may make you more likely to develop this condition:  Having a new sexual partner or multiple sexual partners.  Having unprotected sex.  Douching.  Having an intrauterine device (IUD).  Smoking.  Drug and alcohol abuse.  Taking certain antibiotic medicines.  Being pregnant.  You cannot get bacterial vaginosis from toilet seats, bedding, swimming pools, or contact with objects around you. What are the signs or symptoms? Symptoms of this condition include:  Grey or white vaginal discharge. The discharge can also be watery or foamy.  A fish-like odor with discharge, especially after sexual intercourse or during menstruation.  Itching in and around the vagina.  Burning or pain with urination.  Some women with bacterial vaginosis have no signs or symptoms. How is this diagnosed? This condition is diagnosed based on:  Your medical history.  A physical exam of the vagina.  Testing a sample of vaginal fluid under a microscope to look for a large amount of bad bacteria or abnormal cells. Your health care provider may use a cotton swab  or a small wooden spatula to collect the sample.  How is this treated? This condition is treated with antibiotics. These may be given as a pill, a vaginal cream, or a medicine that is put into the vagina (suppository). If the condition comes back after treatment, a second round of antibiotics may be needed. Follow these instructions at home: Medicines  Take over-the-counter and prescription medicines only as told by your health care provider.  Take or use your antibiotic as told by your health care provider. Do not stop taking or using the antibiotic even if you start to feel better. General instructions  If you have a female sexual partner, tell her that you have a vaginal infection. She should see her health care provider and be treated if she has symptoms. If you have a female sexual partner, he does not need treatment.  During treatment: ? Avoid sexual activity until you finish treatment. ? Do not douche. ? Avoid alcohol as directed by your health care provider. ? Avoid breastfeeding as directed by your health care provider.  Drink enough water and fluids to keep your urine clear or pale yellow.  Keep the area around your vagina and rectum clean. ? Wash the area daily with warm water. ? Wipe yourself from front to back after using the toilet.  Keep all follow-up visits as told by your health care provider. This is important. How is this prevented?  Do not douche.  Wash the outside of your vagina with warm water only.  Use protection when having sex. This includes latex condoms and dental dams.  Limit how many sexual partners you have. To help prevent bacterial vaginosis, it is best to have sex with just   one partner (monogamous).  Make sure you and your sexual partner are tested for STIs.  Wear cotton or cotton-lined underwear.  Avoid wearing tight pants and pantyhose, especially during summer.  Limit the amount of alcohol that you drink.  Do not use any products that  contain nicotine or tobacco, such as cigarettes and e-cigarettes. If you need help quitting, ask your health care provider.  Do not use illegal drugs. Where to find more information:  Centers for Disease Control and Prevention: www.cdc.gov/std  American Sexual Health Association (ASHA): www.ashastd.org  U.S. Department of Health and Human Services, Office on Women's Health: www.womenshealth.gov/ or https://www.womenshealth.gov/a-z-topics/bacterial-vaginosis Contact a health care provider if:  Your symptoms do not improve, even after treatment.  You have more discharge or pain when urinating.  You have a fever.  You have pain in your abdomen.  You have pain during sex.  You have vaginal bleeding between periods. Summary  Bacterial vaginosis is a vaginal infection that occurs when the normal balance of bacteria in the vagina is disrupted.  Because bacterial vaginosis increases your risk for STIs (sexually transmitted infections), getting treated can help reduce your risk for chlamydia, gonorrhea, herpes, and HIV (human immunodeficiency virus). Treatment is also important for preventing complications in pregnant women, because the condition can cause an early (premature) delivery.  This condition is treated with antibiotic medicines. These may be given as a pill, a vaginal cream, or a medicine that is put into the vagina (suppository). This information is not intended to replace advice given to you by your health care provider. Make sure you discuss any questions you have with your health care provider. Document Released: 04/24/2005 Document Revised: 01/08/2016 Document Reviewed: 01/08/2016 Elsevier Interactive Patient Education  2017 Elsevier Inc.  

## 2016-05-30 NOTE — Progress Notes (Addendum)
Subjective:  Beverly Martinez is a 41 y.o. female who presents for basic physical exam for work. Patient denies any current health related concerns. Patient has a history of obesity, pseudotumor cerebri, headaches and hyperlipidemia. Patient also states she has a recurrence of bacterial vaginosis.  Patient denies history of heart disease, lung disease, kidney/liver disease, bleeding disorders, seizures, DM or asthma.   There is no immunization history on file for this patient.  Past Medical History:  Diagnosis Date  . Abnormal Pap smear   . Infection    UTI  . Pregnancy induced hypertension   . Pseudotumor cerebri     Past Surgical History:  Procedure Laterality Date  . CESAREAN SECTION    . COLPOSCOPY    . DILATION AND EVACUATION N/A 09/07/2013   Procedure: DILATATION AND EVACUATION;  Surgeon: Guss Bunde, MD;  Location: [facility];  Service: Gynecology;  Laterality: N/A;  . INDUCED ABORTION    . LUMBAR PUNCTURE    . WISDOM TOOTH EXTRACTION      Social History  Substance Use Topics  . Smoking status: Current Some Day Smoker    Types: Cigarettes  . Smokeless tobacco: Never Used  . Alcohol use 0.0 oz/week     Comment: occasional    No Known Allergies  Current Outpatient Prescriptions  Medication Sig Dispense Refill  . acetaZOLAMIDE (DIAMOX) 500 MG capsule Take 500 mg by mouth 2 (two) times daily.    . cetirizine (ZYRTEC) 10 MG tablet Take 10 mg by mouth daily as needed for allergies.    . metroNIDAZOLE (FLAGYL) 500 MG tablet Take 1 tablet (500 mg total) by mouth 2 (two) times daily. 14 tablet 2   No current facility-administered medications for this visit.     ROS  BP 120/80   Pulse 76   Temp 99 F (37.2 C) (Oral)   Ht 5' 4.5" (1.638 m)   Wt 245 lb 3.2 oz (111.2 kg)   LMP 05/23/2016 (Approximate)   SpO2 98%   BMI 41.44 kg/m    Objective:  BP 120/80   Pulse 76   Temp 99 F (37.2 C) (Oral)   Ht 5' 4.5" (1.638 m)   Wt 245 lb 3.2 oz (111.2 kg)   LMP  05/23/2016 (Approximate)   SpO2 98%   BMI 41.44 kg/m   General Appearance:  Alert, cooperative, no distress, appears stated age  Head:  Normocephalic, without obvious abnormality, atraumatic  Eyes:  PERRL, conjunctiva/corneas clear, EOM's intact, fundi benign, both eyes  Ears:  Normal TM's and external ear canals, both ears  Nose: Nares normal, septum midline,mucosa normal, no drainage or sinus tenderness  Throat: Lips, mucosa, and tongue normal; teeth and gums normal  Neck: Supple, symmetrical, trachea midline, no adenopathy;  thyroid: not enlarged, symmetric, no tenderness/mass/nodules; no carotid bruit or JVD  Back:   Symmetric, no curvature, ROM normal, no CVA tenderness  Lungs:   Clear to auscultation bilaterally, respirations unlabored  Breasts:  No masses or tenderness  Heart:  Regular rate and rhythm, S1 and S2 normal, no murmur, rub, or gallop  Abdomen:   Soft, non-tender, bowel sounds active all four quadrants,  no masses, no organomegaly  Pelvic: Deferred  Extremities: Extremities normal, atraumatic, no cyanosis or edema  Pulses: 2+ and symmetric  Skin: Skin color, texture, turgor normal, no rashes or lesions  Lymph nodes: Cervical, supraclavicular, and axillary nodes normal  Neurologic: Normal        Assessment:  basic physical exam  Plan:  Patient education provided.  No labs needed at this time. Patient will follow up with PCP.  Patient will follow up with neurologist.

## 2016-06-01 LAB — TB SKIN TEST
INDURATION: 0 mm
TB SKIN TEST: NEGATIVE

## 2016-10-16 ENCOUNTER — Other Ambulatory Visit: Payer: Self-pay | Admitting: Obstetrics and Gynecology

## 2016-10-16 DIAGNOSIS — Z1231 Encounter for screening mammogram for malignant neoplasm of breast: Secondary | ICD-10-CM

## 2016-10-24 ENCOUNTER — Ambulatory Visit (HOSPITAL_COMMUNITY): Payer: Medicaid Other

## 2016-11-14 ENCOUNTER — Encounter (HOSPITAL_COMMUNITY): Payer: Self-pay

## 2016-11-14 ENCOUNTER — Ambulatory Visit
Admission: RE | Admit: 2016-11-14 | Discharge: 2016-11-14 | Disposition: A | Discharge status: Home / Self Care | Payer: No Typology Code available for payment source | Source: Ambulatory Visit | Attending: Obstetrics and Gynecology | Admitting: Obstetrics and Gynecology

## 2016-11-14 ENCOUNTER — Ambulatory Visit (HOSPITAL_COMMUNITY)
Admission: RE | Admit: 2016-11-14 | Discharge: 2016-11-14 | Disposition: A | Discharge status: Home / Self Care | Payer: Self-pay | Source: Ambulatory Visit | Attending: Obstetrics and Gynecology | Admitting: Obstetrics and Gynecology

## 2016-11-14 VITALS — BP 114/80 | Ht 63.5 in | Wt 244.2 lb

## 2016-11-14 DIAGNOSIS — Z1231 Encounter for screening mammogram for malignant neoplasm of breast: Secondary | ICD-10-CM

## 2016-11-14 DIAGNOSIS — Z1239 Encounter for other screening for malignant neoplasm of breast: Secondary | ICD-10-CM

## 2016-11-14 NOTE — Patient Instructions (Signed)
Explained breast self awareness with Dorthula Perfect. Patient did not need a Pap smear today due to last Pap smear was in 2016 per patient. Let her know [facility]CP will cover Pap smears every 3 years unless has a history of abnormal Pap smears. Referred patient to the [facility] for a screening mammogram. Appointment scheduled for Tuesday, November 14, 2016 at 1630. Let [facility] know [facility] will follow up with her within the next couple weeks with results of mammogram by letter or phone. Javier A. Hyde verbalized understanding.  Jaxsin Bottomley, Arvil Chaco, RN 4:04 PM

## 2016-11-14 NOTE — Progress Notes (Signed)
Patient complained of cramping and spotting between menstrual periods x 1.5 months. Will refer patient to the [facility] [place] [facility] for follow up.   Pap Smear: Pap smear not completed today. Last Pap smear was in 2016 at Dr. Marcheta Grammes office and normal per patient. Per patient has a history of an abnormal Pap smear 10-15 years ago that a colposcopy was completed for follow up. No Pap smear results are in EPIC.  Physical exam: Breasts Breasts symmetrical. No skin abnormalities bilateral breasts. No nipple retraction bilateral breasts. No nipple discharge bilateral breasts. No lymphadenopathy. No lumps palpated bilateral breasts. No complaints of pain or tenderness on exam. Referred patient to the [facility] for a screening mammogram. Appointment scheduled for Tuesday, November 14, 2016 at 1630.        Pelvic/Bimanual No Pap smear completed today since last Pap smear was in 2016 per patient. Pap smear not indicated per BCCCP guidelines.   Smoking History: Patient has never smoked.  Patient Navigation: Patient education provided. Access to services provided for patient through [facility] program.

## 2016-11-15 ENCOUNTER — Other Ambulatory Visit: Payer: Self-pay | Admitting: Obstetrics and Gynecology

## 2016-11-15 ENCOUNTER — Encounter (HOSPITAL_COMMUNITY): Payer: Self-pay | Admitting: *Deleted

## 2016-11-15 DIAGNOSIS — R928 Other abnormal and inconclusive findings on diagnostic imaging of breast: Secondary | ICD-10-CM

## 2016-11-16 ENCOUNTER — Ambulatory Visit
Admission: RE | Admit: 2016-11-16 | Discharge: 2016-11-16 | Disposition: A | Discharge status: Home / Self Care | Payer: No Typology Code available for payment source | Source: Ambulatory Visit | Attending: Obstetrics and Gynecology | Admitting: Obstetrics and Gynecology

## 2016-11-16 DIAGNOSIS — R928 Other abnormal and inconclusive findings on diagnostic imaging of breast: Secondary | ICD-10-CM

## 2016-11-21 ENCOUNTER — Other Ambulatory Visit: Payer: No Typology Code available for payment source

## 2016-12-01 ENCOUNTER — Encounter: Payer: No Typology Code available for payment source | Admitting: Obstetrics & Gynecology

## 2016-12-22 ENCOUNTER — Encounter: Payer: Self-pay | Admitting: Obstetrics and Gynecology

## 2017-01-12 ENCOUNTER — Encounter: Payer: Self-pay | Admitting: Obstetrics & Gynecology

## 2017-04-20 ENCOUNTER — Other Ambulatory Visit (HOSPITAL_COMMUNITY): Payer: Self-pay | Admitting: *Deleted

## 2017-04-20 DIAGNOSIS — N6310 Unspecified lump in the right breast, unspecified quadrant: Secondary | ICD-10-CM

## 2017-05-31 ENCOUNTER — Ambulatory Visit (HOSPITAL_COMMUNITY)
Admission: RE | Admit: 2017-05-31 | Discharge: 2017-05-31 | Disposition: A | Discharge status: Home / Self Care | Payer: Self-pay | Source: Ambulatory Visit | Attending: Obstetrics and Gynecology | Admitting: Obstetrics and Gynecology

## 2017-05-31 ENCOUNTER — Encounter (HOSPITAL_COMMUNITY): Payer: Self-pay

## 2017-05-31 ENCOUNTER — Ambulatory Visit: Payer: Self-pay

## 2017-05-31 ENCOUNTER — Other Ambulatory Visit (HOSPITAL_COMMUNITY): Payer: Self-pay | Admitting: Obstetrics and Gynecology

## 2017-05-31 ENCOUNTER — Ambulatory Visit
Admission: RE | Admit: 2017-05-31 | Discharge: 2017-05-31 | Disposition: A | Discharge status: Home / Self Care | Payer: No Typology Code available for payment source | Source: Ambulatory Visit | Attending: Obstetrics and Gynecology | Admitting: Obstetrics and Gynecology

## 2017-05-31 DIAGNOSIS — N6310 Unspecified lump in the right breast, unspecified quadrant: Secondary | ICD-10-CM

## 2017-05-31 NOTE — Progress Notes (Signed)
Patient is currently on menstrual period and unable to complete Pap smear today. Patient scheduled to come to [facility] on Tuesday, June 12, 2017 at 1500 for a Pap smear.

## 2017-06-12 ENCOUNTER — Ambulatory Visit (HOSPITAL_COMMUNITY): Payer: No Typology Code available for payment source

## 2017-06-27 ENCOUNTER — Encounter: Payer: Self-pay | Admitting: Nurse Practitioner

## 2017-06-27 ENCOUNTER — Ambulatory Visit: Payer: Self-pay | Admitting: Nurse Practitioner

## 2017-06-27 VITALS — BP 102/70 | HR 94 | Temp 98.3°F | Resp 20 | Wt 257.6 lb

## 2017-06-27 DIAGNOSIS — R51 Headache: Secondary | ICD-10-CM

## 2017-06-27 DIAGNOSIS — R519 Headache, unspecified: Secondary | ICD-10-CM

## 2017-06-27 DIAGNOSIS — R6889 Other general symptoms and signs: Secondary | ICD-10-CM

## 2017-06-27 LAB — POCT INFLUENZA A/B
Influenza A, POC: NEGATIVE
Influenza B, POC: NEGATIVE

## 2017-06-27 MED ORDER — PREDNISONE 20 MG PO TABS: 20.0000 mg | ORAL_TABLET | Freq: Every day | ORAL | 0 refills | Status: DC

## 2017-06-27 NOTE — Patient Instructions (Addendum)
Upper Respiratory Infection, Adult Most upper respiratory infections (URIs) are a viral infection of the air passages leading to the lungs. A URI affects the nose, throat, and upper air passages. The most common type of URI is nasopharyngitis and is typically referred to as "the common cold." URIs run their course and usually go away on their own. Most of the time, a URI does not require medical attention, but sometimes a bacterial infection in the upper airways can follow a viral infection. This is called a secondary infection. Sinus and middle ear infections are common types of secondary upper respiratory infections. Bacterial pneumonia can also complicate a URI. A URI can worsen asthma and chronic obstructive pulmonary disease (COPD). Sometimes, these complications can require emergency medical care and may be life threatening. What are the causes? Almost all URIs are caused by viruses. A virus is a type of germ and can spread from one person to another. What increases the risk? You may be at risk for a URI if:  You smoke.  You have chronic heart or lung disease.  You have a weakened defense (immune) system.  You are very young or very old.  You have nasal allergies or asthma.  You work in crowded or poorly ventilated areas.  You work in health care facilities or schools.  What are the signs or symptoms? Symptoms typically develop 2-3 days after you come in contact with a cold virus. Most viral URIs last 7-10 days. However, viral URIs from the influenza virus (flu virus) can last 14-18 days and are typically more severe. Symptoms may include:  Runny or stuffy (congested) nose.  Sneezing.  Cough.  Sore throat.  Headache.  Fatigue.  Fever.  Loss of appetite.  Pain in your forehead, behind your eyes, and over your cheekbones (sinus pain).  Muscle aches.  How is this diagnosed? Your health care provider may diagnose a URI by:  Physical exam.  Tests to check that your  symptoms are not due to another condition such as: ? Strep throat. ? Sinusitis. ? Pneumonia. ? Asthma.  How is this treated? A URI goes away on its own with time. It cannot be cured with medicines, but medicines may be prescribed or recommended to relieve symptoms. Medicines may help:  Reduce your fever.  Reduce your cough.  Relieve nasal congestion.  Follow these instructions at home:  Take medicines only as directed by your health care provider.  Gargle warm saltwater or take cough drops to comfort your throat as directed by your health care provider.  Use a warm mist humidifier or inhale steam from a shower to increase air moisture. This may make it easier to breathe.  Drink enough fluid to keep your urine clear or pale yellow.  Eat soups and other clear broths and maintain good nutrition.  Rest as needed.  Return to work when your temperature has returned to normal or as your health care provider advises. You may need to stay home longer to avoid infecting others. You can also use a face mask and careful hand washing to prevent spread of the virus.  Increase the usage of your inhaler if you have asthma.  Do not use any tobacco products, including cigarettes, chewing tobacco, or electronic cigarettes. If you need help quitting, ask your health care provider. How is this prevented? The best way to protect yourself from getting a cold is to practice good hygiene.  Avoid oral or hand contact with people with cold symptoms.  Wash your   hands often if contact occurs.  There is no clear evidence that vitamin C, vitamin E, echinacea, or exercise reduces the chance of developing a cold. However, it is always recommended to get plenty of rest, exercise, and practice good nutrition. Contact a health care provider if:  You are getting worse rather than better.  Your symptoms are not controlled by medicine.  You have chills.  You have worsening shortness of breath.  You have  brown or red mucus.  You have yellow or brown nasal discharge.  You have pain in your face, especially when you bend forward.  You have a fever.  You have swollen neck glands.  You have pain while swallowing.  You have white areas in the back of your throat. Get help right away if:  You have severe or persistent: ? Headache. ? Ear pain. ? Sinus pain. ? Chest pain.  You have chronic lung disease and any of the following: ? Wheezing. ? Prolonged cough. ? Coughing up blood. ? A change in your usual mucus.  You have a stiff neck.  You have changes in your: ? Vision. ? Hearing. ? Thinking. ? Mood. This information is not intended to replace advice given to you by your health care provider. Make sure you discuss any questions you have with your health care provider. Document Released: 10/18/2000 Document Revised: 12/26/2015 Document Reviewed: 07/30/2013 Elsevier Interactive Patient Education  2018 Elsevier Inc.  

## 2017-06-27 NOTE — Progress Notes (Signed)
Subjective:  Beverly Martinez is a 42 y.o. female who presents for evaluation of upper respiratory infection.  Symptoms include nasal congestion, no fever, non productive cough, sneezing, sore throat and headache.  Onset of symptoms was 3 days ago, and has been unchanged since that time.  Treatment to date:  none.  High risk factors for influenza complications:  none.  The following portions of the patient's history were reviewed and updated as appropriate:  allergies, current medications and past medical history.  Constitutional: positive for fatigue and malaise, negative for anorexia and fevers Eyes: negative Ears, nose, mouth, throat, and face: positive for nasal congestion and sneezing and scratchy throat, negative for ear drainage and earaches Respiratory: positive for cough, negative for asthma, chronic bronchitis, dyspnea on exertion, sputum and wheezing Cardiovascular: negative Gastrointestinal: negative Neurological: positive for headaches, negative for coordination problems, dizziness, paresthesia, tremors and weakness Allergic/Immunologic: positive for hay fever Objective:  BP 102/70 (BP Location: Right Arm, Patient Position: Sitting, Cuff Size: Large)   Pulse 94   Temp 98.3 F (36.8 C) (Oral)   Resp 20   Wt 257 lb 9.6 oz (116.8 kg)   LMP 05/28/2017 (Exact Date)   SpO2 100%   BMI 44.92 kg/m  General appearance: alert, cooperative, fatigued and no distress Head: Normocephalic, without obvious abnormality, atraumatic Eyes: conjunctivae/corneas clear. PERRL, EOM's intact. Fundi benign. Ears: normal TM's and external ear canals both ears Nose: no discharge, turbinates swollen, inflamed, no sinus tenderness Throat: lips, mucosa, and tongue normal; teeth and gums normal Lungs: clear to auscultation bilaterally Heart: regular rate and rhythm, S1, S2 normal, no murmur, click, rub or gallop Abdomen: soft, non-tender; bowel sounds normal; no masses,  no organomegaly Pulses: 2+  and symmetric Skin: Skin color, texture, turgor normal. No rashes or lesions Lymph nodes: cervical and submandibular nodes normal Neurologic: Grossly normal    Assessment:  viral upper respiratory illness    Plan:  Discussed diagnosis and treatment of URI. Discussed the importance of avoiding unnecessary antibiotic therapy. Educational material distributed and questions answered. Suggested symptomatic OTC remedies. Supportive care with appropriate antipyretics and fluids. Nasal saline spray for congestion. Prednisone per orders. Follow up as needed.

## 2017-06-29 ENCOUNTER — Telehealth: Payer: Self-pay | Admitting: Emergency Medicine

## 2017-07-10 ENCOUNTER — Ambulatory Visit (HOSPITAL_COMMUNITY)
Admission: RE | Admit: 2017-07-10 | Discharge: 2017-07-10 | Disposition: A | Discharge status: Home / Self Care | Payer: No Typology Code available for payment source | Source: Ambulatory Visit | Attending: Obstetrics and Gynecology | Admitting: Obstetrics and Gynecology

## 2017-07-10 ENCOUNTER — Encounter (HOSPITAL_COMMUNITY): Payer: Self-pay

## 2017-07-10 VITALS — BP 124/82 | Ht 63.5 in

## 2017-07-10 DIAGNOSIS — Z01419 Encounter for gynecological examination (general) (routine) without abnormal findings: Secondary | ICD-10-CM

## 2017-07-10 NOTE — Patient Instructions (Addendum)
Explained to [employer] that BCCCP will cover Pap smears and HPV typing every 5 years unless has a history of abnormal Pap smears. Let patient know will follow up with her within the next couple weeks with results of Pap smear and wet prep by phone. Reminded patient that her next mammogram is due after November 28, 2017 and that she will need to renew her BCCCP for it to be covered by [employer]. Told patient to call in May to schedule her appointment with BCCCP. Discussed smoking cessation with patient. Referred to the [facility] Quitline and gave resources to the free smoking cessation classes at [facility]. [name] verbalized understanding.  Beverly Martinez, Arvil Chaco, RN 3:53 PM

## 2017-07-10 NOTE — Progress Notes (Signed)
No complaints today.   Pap Smear: Pap smear completed today. Last Pap smear was in 2016 at Dr. Marcheta Grammes office and normal per patient. Per patient has a history of an abnormal Pap smear 10-15 years ago that a colposcopy was completed for follow up. Per patient has had at least three normal Pap smears since colposcopy. No Pap smear results are in EPIC.  Pelvic/Bimanual   Ext Genitalia No lesions, no swelling and no discharge observed on external genitalia.         Vagina Vagina pink and normal texture. No lesions and thick white vaginal discharge observed in vagina. Wet prep completed.          Cervix Cervix is present. Cervix pink and of normal texture. Thick white discharge observed on cervix.    Uterus Uterus is present and palpable. Uterus in normal position and normal size.        Adnexae Bilateral ovaries present and palpable. No tenderness on palpation.         Rectovaginal No rectal exam completed today since patient had no rectal complaints. No skin abnormalities observed on exam.    Smoking History: Patient is a current smoker. Discussed smoking cessation with patient. Referred to the [facility] Quitline and gave resources to the free smoking cessation classes at [facility].  Patient Navigation: Patient education provided. Access to services provided for patient through BCCCP program.   Breast and Cervical Cancer Risk Assessment: Patient has no family history of breast cancer, known genetic mutations, or radiation treatment to the chest before age 2. Patient has a history of an abnormal Pap smear that a colposcopy was completed for follow-up. Patient has no history of being immunocompromised or DES exposure in-utero.

## 2017-07-10 NOTE — Addendum Note (Signed)
Encounter addended by: Loletta Parish, RN on: 07/10/2017 4:09 PM  Actions taken: Sign clinical note

## 2017-07-12 LAB — CYTOLOGY - PAP
Adequacy: ABSENT
BACTERIAL VAGINITIS: POSITIVE — AB
CANDIDA VAGINITIS: NEGATIVE
Diagnosis: NEGATIVE
HPV (WINDOPATH): NOT DETECTED
TRICH (WINDOWPATH): NEGATIVE

## 2017-07-13 ENCOUNTER — Telehealth (HOSPITAL_COMMUNITY): Payer: Self-pay | Admitting: *Deleted

## 2017-07-13 ENCOUNTER — Other Ambulatory Visit (HOSPITAL_COMMUNITY): Payer: Self-pay | Admitting: *Deleted

## 2017-07-13 MED ORDER — METRONIDAZOLE 500 MG PO TABS: 500.0000 mg | ORAL_TABLET | Freq: Two times a day (BID) | ORAL | 0 refills | Status: DC

## 2017-07-13 NOTE — Telephone Encounter (Signed)
Telephoned patient at home number and advised patient of negative pap smear results. HPV was negative. Next pap due in five years.  Advised patient pap did show BV and medication was called to pharmacy. Advised patient to finish all medication and no alcohol while taking medication. Patient voiced understanding.

## 2017-10-03 ENCOUNTER — Ambulatory Visit: Payer: No Typology Code available for payment source

## 2017-12-10 ENCOUNTER — Other Ambulatory Visit (HOSPITAL_COMMUNITY): Payer: Self-pay | Admitting: *Deleted

## 2017-12-10 DIAGNOSIS — N6310 Unspecified lump in the right breast, unspecified quadrant: Secondary | ICD-10-CM

## 2017-12-20 NOTE — Telephone Encounter (Signed)
error 

## 2018-01-30 ENCOUNTER — Emergency Department (HOSPITAL_COMMUNITY)
Admission: EM | Admit: 2018-01-30 | Discharge: 2018-01-30 | Disposition: A | Discharge status: Home / Self Care | Payer: Self-pay | Attending: Emergency Medicine | Admitting: Emergency Medicine

## 2018-01-30 ENCOUNTER — Other Ambulatory Visit: Payer: Self-pay

## 2018-01-30 ENCOUNTER — Encounter (HOSPITAL_COMMUNITY): Payer: Self-pay

## 2018-01-30 ENCOUNTER — Emergency Department (HOSPITAL_COMMUNITY): Payer: Self-pay

## 2018-01-30 DIAGNOSIS — N200 Calculus of kidney: Secondary | ICD-10-CM | POA: Insufficient documentation

## 2018-01-30 DIAGNOSIS — F17210 Nicotine dependence, cigarettes, uncomplicated: Secondary | ICD-10-CM | POA: Insufficient documentation

## 2018-01-30 DIAGNOSIS — Z79899 Other long term (current) drug therapy: Secondary | ICD-10-CM | POA: Insufficient documentation

## 2018-01-30 DIAGNOSIS — R109 Unspecified abdominal pain: Secondary | ICD-10-CM

## 2018-01-30 LAB — URINALYSIS, ROUTINE W REFLEX MICROSCOPIC
Bilirubin Urine: NEGATIVE
Glucose, UA: NEGATIVE mg/dL
HGB URINE DIPSTICK: NEGATIVE
KETONES UR: 5 mg/dL — AB
Leukocytes, UA: NEGATIVE
NITRITE: NEGATIVE
Protein, ur: NEGATIVE mg/dL
Specific Gravity, Urine: 1.023 (ref 1.005–1.030)
pH: 7 (ref 5.0–8.0)

## 2018-01-30 LAB — I-STAT BETA HCG BLOOD, ED (MC, WL, AP ONLY)

## 2018-01-30 MED ORDER — KETOROLAC TROMETHAMINE 15 MG/ML IJ SOLN
30.0000 mg | Freq: Once | INTRAMUSCULAR | Status: AC
  Administered 2018-01-30: 30 mg via INTRAVENOUS
  Filled 2018-01-30: qty 2

## 2018-01-30 NOTE — ED Notes (Signed)
Bed: PP95 Expected date:  Expected time:  Means of arrival:  Comments: EMS 42 yo female flank pain to right side abdomen

## 2018-01-30 NOTE — ED Notes (Signed)
Per pt, she thought she was getting an UTI due to urinary frequency/urgency. She states that she had some amoxicillin in the fridge from her daughter and took 7 ml's. After taking the amoxicillin, she started to have lower abdomen pain. She thought it was gas, so she proceeded to take an OTC gas pill with no relief.

## 2018-01-30 NOTE — ED Notes (Signed)
ED Provider at bedside. 

## 2018-01-30 NOTE — Discharge Instructions (Addendum)
Drink plenty of fluids especially sports drinks when you are hot or sweaty. You can take ibuprofen 600 mg every 6 hrs for pain. Return to the ED if you get a fever, have uncontrolled vomiting or the pain gets severe again. You can follow up at [facility] with Dr Tresa Moore.

## 2018-01-30 NOTE — ED Provider Notes (Signed)
[facility] DEPT Provider Note   CSN: [id number] Arrival date & time: 01/30/18  0152  Time seen 05:25 AM  History   Chief Complaint Chief Complaint  Patient presents with  . Flank Pain    HPI Beverly Martinez is a 42 y.o. female.  HPI patient states that when she got off work this afternoon she thought maybe she was trying to get a urinary tract infection because she was having urgency.  She took some leftover amoxicillin of her child about 4 PM.  She states about 9 PM she started having abdominal cramping that started in her left flank and went into her left abdomen.  She said she was pressing around on her abdomen thinking she was having some trapped gas.  She described the pain as dull, aching, and pressure.  She states it hurt a little bit more when she palpated the area.  She states the pain was constant but it waxed and waned.  She had nausea and she induced vomiting thinking it would make her feel better.  She describes burping.  She denies dysuria but has frequency and urgency.  She denies any fever.  She states currently her pain is better after EMS gave her medication but she feels like it starting to return.  She states she is never had this pain before.  She denies any family history of kidney stones.  Patient has pseudotumor cerebri and states she ran out of her Diamox 2 or 3 months ago because she does not have insurance.  PCP Nolene Ebbs, MD   Past Medical History:  Diagnosis Date  . Abnormal Pap smear   . Infection    UTI  . Pseudotumor cerebri     Patient Active Problem List   Diagnosis Date Noted  . Numbness of tongue 10/27/2014  . Tobacco abuse 10/27/2014  . Morbid obesity ([place]) 10/27/2014  . Elevated blood pressure 10/27/2014  . Missed abortion 07/03/2013  . ALLERGIC RHINITIS 05/22/2008  . DYSPNEA 05/22/2008    Past Surgical History:  Procedure Laterality Date  . CESAREAN SECTION    . COLPOSCOPY    . DILATION  AND EVACUATION N/A 09/07/2013   Procedure: DILATATION AND EVACUATION;  Surgeon: Guss Bunde, MD;  Location: [facility];  Service: Gynecology;  Laterality: N/A;  . INDUCED ABORTION    . LUMBAR PUNCTURE    . WISDOM TOOTH EXTRACTION       OB History    Gravida  6   Para  2   Term      Preterm  2   AB  4   Living  2     SAB  2   TAB  2   Ectopic      Multiple      Live Births  2            Home Medications    Prior to Admission medications   Medication Sig Start Date End Date Taking? Authorizing Provider  acetaZOLAMIDE (DIAMOX) 500 MG capsule Take 500 mg by mouth 2 (two) times daily.   Yes [provider]  cetirizine (ZYRTEC) 10 MG tablet Take 10 mg by mouth daily as needed for allergies.   Yes [provider]  acetaZOLAMIDE (DIAMOX) 500 MG capsule Take 1 capsule (500 mg total) by mouth 2 (two) times daily. Patient not taking: Reported on 01/30/2018 05/30/16 06/30/16  Kara Dies, NP  fluticasone [facility]) 50 MCG/ACT nasal spray Place 2 sprays into both  nostrils daily. Patient not taking: Reported on 01/30/2018 05/30/16 06/29/16  Kara Dies, NP  metroNIDAZOLE (FLAGYL) 500 MG tablet Take 1 tablet (500 mg total) by mouth 2 (two) times daily. Patient not taking: Reported on 01/30/2018 05/30/16   Kara Dies, NP  metroNIDAZOLE (FLAGYL) 500 MG tablet Take 1 tablet (500 mg total) by mouth 2 (two) times daily. Patient not taking: Reported on 01/30/2018 07/13/17   Constant, Peggy, MD  predniSONE (DELTASONE) 20 MG tablet Take 1 tablet (20 mg total) by mouth daily with breakfast. Patient not taking: Reported on 07/10/2017 06/27/17   Kara Dies, NP    Family History Family History  Problem Relation Age of Onset  . Hypertension Mother   . Cancer Maternal Grandmother        pancreatic  . Hypertension Maternal Grandmother     Social History Social History   Tobacco Use  . Smoking status: Current Every Day Smoker     Types: Cigarettes    Last attempt to quit: 11/15/2014    Years since quitting: 3.2  . Smokeless tobacco: Never Used  Substance Use Topics  . Alcohol use: Yes    Alcohol/week: 0.0 standard drinks    Comment: occasional  . Drug use: No     Allergies   Patient has no known allergies.   Review of Systems Review of Systems  All other systems reviewed and are negative.    Physical Exam Updated Vital Signs BP 131/87   Pulse (!) 57   Resp 17   Ht 5' 3.5" (1.613 m)   Wt 113.4 kg   SpO2 97%   BMI 43.59 kg/m   Vital signs normal except bradycardia   Physical Exam  Constitutional: She is oriented to person, place, and time. She appears well-developed and well-nourished.  Non-toxic appearance. She does not appear ill. No distress.  HENT:  Head: Normocephalic and atraumatic.  Right Ear: External ear normal.  Left Ear: External ear normal.  Nose: Nose normal. No mucosal edema or rhinorrhea.  Mouth/Throat: Oropharynx is clear and moist and mucous membranes are normal. No dental abscesses or uvula swelling.  Eyes: Pupils are equal, round, and reactive to light. Conjunctivae and EOM are normal.  Neck: Normal range of motion and full passive range of motion without pain. Neck supple.  Cardiovascular: Normal rate, regular rhythm and normal heart sounds. Exam reveals no gallop and no friction rub.  No murmur heard. Pulmonary/Chest: Effort normal and breath sounds normal. No respiratory distress. She has no wheezes. She has no rhonchi. She has no rales. She exhibits no tenderness and no crepitus.  Abdominal: Soft. Normal appearance and bowel sounds are normal. She exhibits no distension. There is no tenderness. There is no rebound and no guarding.    Area of pain noted but she is nontender to palpation.  She is also nontender over her left flank.  Musculoskeletal: Normal range of motion. She exhibits no edema or tenderness.  Moves all extremities well.   Neurological: She is alert and  oriented to person, place, and time. She has normal strength. No cranial nerve deficit.  Skin: Skin is warm, dry and intact. No rash noted. No erythema. No pallor.  Psychiatric: She has a normal mood and affect. Her speech is normal and behavior is normal. Her mood appears not anxious.  Nursing note and vitals reviewed.    ED Treatments / Results  Labs (all labs ordered are listed, but only abnormal results are displayed) Results for orders placed  or performed during the hospital encounter of 01/30/18  Urinalysis, Routine w reflex microscopic- may I&O cath if menses  Result Value Ref Range   Color, Urine YELLOW YELLOW   APPearance CLEAR CLEAR   Specific Gravity, Urine 1.023 1.005 - 1.030   pH 7.0 5.0 - 8.0   Glucose, UA NEGATIVE NEGATIVE mg/dL   Hgb urine dipstick NEGATIVE NEGATIVE   Bilirubin Urine NEGATIVE NEGATIVE   Ketones, ur 5 (A) NEGATIVE mg/dL   Protein, ur NEGATIVE NEGATIVE mg/dL   Nitrite NEGATIVE NEGATIVE   Leukocytes, UA NEGATIVE NEGATIVE  I-Stat beta hCG blood, ED  Result Value Ref Range   I-stat hCG, quantitative <5.0 <5 mIU/mL   Comment 3           Laboratory interpretation all normal      EKG None  Radiology Ct Renal Stone Study  Result Date: 01/30/2018 CLINICAL DATA:  Flank pain EXAM: CT ABDOMEN AND PELVIS WITHOUT CONTRAST TECHNIQUE: Multidetector CT imaging of the abdomen and pelvis was performed following the standard protocol without oral or IV contrast. COMPARISON:  None. FINDINGS: Lower chest: There is bibasilar atelectatic change. No lung base edema or consolidation. Hepatobiliary: No focal liver lesions are evident on this noncontrast enhanced study. Gallbladder wall is not appreciably thickened. There is no biliary duct dilatation. Pancreas: No pancreatic mass or inflammatory focus. Spleen: No splenic lesions are evident. Adrenals/Urinary Tract: Adrenals bilaterally appear unremarkable. Kidneys bilaterally show no evident mass or hydronephrosis on  either side. There is a 2 mm calculus in the lower pole left kidney, nonobstructing. There is no evident ureteral calculus on either side. Urinary bladder is midline with wall thickness within normal limits. Stomach/Bowel: There is no appreciable bowel wall or mesenteric thickening. No evident bowel obstruction. No free air or portal venous air. Vascular/Lymphatic: No abdominal aortic aneurysm. No vascular lesions are evident on this noncontrast enhanced study. There is no appreciable adenopathy in the abdomen or pelvis. Reproductive: Uterus is anteverted. There are small apparent nabothian cysts in the cervix. Beyond the small cervical nabothian cysts, there is no evident pelvic mass. Other: Appendix appears unremarkable. There is no ascites or abscess in the abdomen or pelvis. There is thinning of the midline rectus muscle. Musculoskeletal: There are no blastic or lytic bone lesions. No intramuscular lesions are evident. IMPRESSION: 1. 2 mm nonobstructing calculus lower pole left kidney. No evident hydronephrosis or ureteral calculus on either side. 2. No bowel obstruction. No abscess in the abdomen or pelvis. Appendix appears normal. 3.  Small cervical nabothian cysts noted. Electronically Signed   By: Lowella Grip III M.D.   On: 01/30/2018 07:22    Procedures Procedures (including critical care time)  Medications Ordered in ED Medications  ketorolac (TORADOL) 15 MG/ML injection 30 mg (30 mg Intravenous Given 01/30/18 0557)     Initial Impression / Assessment and Plan / ED Course  I have reviewed the triage vital signs and the nursing notes.  Pertinent labs & imaging results that were available during my care of the patient were reviewed by me and considered in my medical decision making (see chart for details).     EMS had given patient 150 mcg of fentanyl.  She was given Toradol IV for suspected renal stone disease.  We discussed getting a CT renal to look for kidney stone, also she could  have diverticulitis although less likely than a renal stone, or just plain urinary tract infection.  Discussed her CT results. Pt pain is better after toradol. Discussed  need to stay hydrated especially during the summer heat. She "want it gone". Discussed too small for lithotripsy. Will refer to Urology.  Discussed reasons to return to the ED such as fever, uncontrolled vomiting, or severe pain again.  Final Clinical Impressions(s) / ED Diagnoses   Final diagnoses:  Left lateral abdominal pain  Renal stone    ED Discharge Orders    None    OTC ibuprofen  Plan discharge  Rolland Porter, MD, Barbette Or, MD 01/30/18 [phone]

## 2018-01-30 NOTE — ED Triage Notes (Signed)
Pt coming from home. EMS states patient began having flank plan this evening radiating into abdomen. +N/V. No problems urinating today.  Pain and cramping in abdomen. Pt has taken 75ml of Amoxicillin she had at home with no relief, per EMS.  Per EMS 20g R AC 4mg  Zofran 172mcg Fentanyl

## 2018-01-31 ENCOUNTER — Ambulatory Visit
Admission: RE | Admit: 2018-01-31 | Discharge: 2018-01-31 | Disposition: A | Discharge status: Home / Self Care | Payer: Self-pay | Source: Ambulatory Visit | Attending: Obstetrics and Gynecology | Admitting: Obstetrics and Gynecology

## 2018-01-31 ENCOUNTER — Ambulatory Visit
Admission: RE | Admit: 2018-01-31 | Discharge: 2018-01-31 | Disposition: A | Discharge status: Home / Self Care | Payer: PRIVATE HEALTH INSURANCE | Source: Ambulatory Visit | Attending: Obstetrics and Gynecology | Admitting: Obstetrics and Gynecology

## 2018-01-31 ENCOUNTER — Encounter (HOSPITAL_COMMUNITY): Payer: Self-pay

## 2018-01-31 ENCOUNTER — Ambulatory Visit (HOSPITAL_COMMUNITY)
Admission: RE | Admit: 2018-01-31 | Discharge: 2018-01-31 | Disposition: A | Discharge status: Home / Self Care | Payer: Self-pay | Source: Ambulatory Visit | Attending: Obstetrics and Gynecology | Admitting: Obstetrics and Gynecology

## 2018-01-31 VITALS — BP 124/84 | Ht 64.0 in | Wt 252.6 lb

## 2018-01-31 DIAGNOSIS — N6310 Unspecified lump in the right breast, unspecified quadrant: Secondary | ICD-10-CM

## 2018-01-31 DIAGNOSIS — Z1239 Encounter for other screening for malignant neoplasm of breast: Secondary | ICD-10-CM

## 2018-01-31 NOTE — Progress Notes (Signed)
Patient referred to [facility] due recommending 19-month follow-up for a benign right lymph node. Last right diagnostic mammogram completed 05/31/2017.  Pap Smear: Pap smear not completed today. Last Pap smear was 07/10/2017 at [facility] and normal with negative HPV. Per patient has a history of an abnormal Pap smear 11-16 years ago that a colposcopy was completed for follow up. Last Pap smear result is are in EPIC.  Physical exam: Breasts Breasts symmetrical. No skin abnormalities bilateral breasts. No nipple retraction bilateral breasts. No nipple discharge bilateral breasts. No lymphadenopathy. No lumps palpated bilateral breasts. No complaints of pain or tenderness on exam. Referred patient to the [facility] for a diagnostic mammogram per recommendation. Appointment scheduled for Thursday, January 31, 2018 at 1050.        Pelvic/Bimanual No Pap smear completed today since last Pap smear and HPV typing was 07/10/2017. Pap smear not indicated per BCCCP guidelines.   Smoking History: Patient is a current smoker. Discussed smoking cessation with patient. Referred to the [facility] Quitline and gave resources to the free smoking cessation classes at [facility].  Patient Navigation: Patient education provided. Access to services provided for patient through BCCCP program.   Breast and Cervical Cancer Risk Assessment: Patient has no family history of breast cancer, known genetic mutations, or radiation treatment to the chest before age 58. Per patient has a history of cervical dysplasia. Patient has no history of being immunocompromised or DES exposure in-utero.  Risk Assessment    Risk Scores      01/31/2018   Last edited by: Armond Hang, LPN   5-year risk: 0.6 %   Lifetime risk: 9.6 %

## 2018-01-31 NOTE — Patient Instructions (Signed)
Explained breast self awareness with [place]. Patient did not need a Pap smear today due to last Pap smear and HPV typing was 07/10/2017. Let her know BCCCP will cover Pap smears and HPV typing every 5 years unless has a history of abnormal Pap smears. Referred patient to the [facility] for a diagnostic mammogram per recommendation. Appointment scheduled for Thursday, January 31, 2018 at 1050. Discussed smoking cessation with patient. Referred to the [facility] Quitline and gave resources to the free smoking cessation classes at [facility]. [place] verbalized understanding.  Sylver Vantassell, Arvil Chaco, RN 9:51 AM

## 2018-02-13 ENCOUNTER — Encounter (HOSPITAL_COMMUNITY): Payer: Self-pay | Admitting: *Deleted

## 2018-02-17 ENCOUNTER — Emergency Department (HOSPITAL_COMMUNITY): Payer: Self-pay

## 2018-02-17 ENCOUNTER — Encounter (HOSPITAL_COMMUNITY): Payer: Self-pay | Admitting: Emergency Medicine

## 2018-02-17 ENCOUNTER — Emergency Department (HOSPITAL_COMMUNITY)
Admission: EM | Admit: 2018-02-17 | Discharge: 2018-02-17 | Disposition: A | Discharge status: Home / Self Care | Payer: Self-pay | Attending: Emergency Medicine | Admitting: Emergency Medicine

## 2018-02-17 DIAGNOSIS — R519 Headache, unspecified: Secondary | ICD-10-CM

## 2018-02-17 DIAGNOSIS — F17210 Nicotine dependence, cigarettes, uncomplicated: Secondary | ICD-10-CM | POA: Insufficient documentation

## 2018-02-17 DIAGNOSIS — G519 Disorder of facial nerve, unspecified: Secondary | ICD-10-CM | POA: Insufficient documentation

## 2018-02-17 DIAGNOSIS — R51 Headache: Secondary | ICD-10-CM | POA: Insufficient documentation

## 2018-02-17 LAB — CBC WITH DIFFERENTIAL/PLATELET
Abs Immature Granulocytes: 0.02 10*3/uL (ref 0.00–0.07)
Basophils Absolute: 0 10*3/uL (ref 0.0–0.1)
Basophils Relative: 0 %
EOS ABS: 0.1 10*3/uL (ref 0.0–0.5)
Eosinophils Relative: 1 %
HEMATOCRIT: 42.4 % (ref 36.0–46.0)
HEMOGLOBIN: 13.3 g/dL (ref 12.0–15.0)
IMMATURE GRANULOCYTES: 0 %
LYMPHS ABS: 2.9 10*3/uL (ref 0.7–4.0)
Lymphocytes Relative: 44 %
MCH: 29 pg (ref 26.0–34.0)
MCHC: 31.4 g/dL (ref 30.0–36.0)
MCV: 92.4 fL (ref 80.0–100.0)
MONOS PCT: 6 %
Monocytes Absolute: 0.4 10*3/uL (ref 0.1–1.0)
Neutro Abs: 3.3 10*3/uL (ref 1.7–7.7)
Neutrophils Relative %: 49 %
Platelets: 282 10*3/uL (ref 150–400)
RBC: 4.59 MIL/uL (ref 3.87–5.11)
RDW: 13.2 % (ref 11.5–15.5)
WBC: 6.7 10*3/uL (ref 4.0–10.5)
nRBC: 0 % (ref 0.0–0.2)

## 2018-02-17 LAB — COMPREHENSIVE METABOLIC PANEL
ALK PHOS: 42 U/L (ref 38–126)
ALT: 18 U/L (ref 0–44)
AST: 17 U/L (ref 15–41)
Albumin: 3.7 g/dL (ref 3.5–5.0)
Anion gap: 8 (ref 5–15)
BILIRUBIN TOTAL: 0.4 mg/dL (ref 0.3–1.2)
BUN: 12 mg/dL (ref 6–20)
CALCIUM: 9 mg/dL (ref 8.9–10.3)
CO2: 28 mmol/L (ref 22–32)
Chloride: 107 mmol/L (ref 98–111)
Creatinine, Ser: 0.83 mg/dL (ref 0.44–1.00)
GFR calc Af Amer: >60 mL/min (ref >60)
GFR calc non Af Amer: >60 mL/min (ref >60)
Glucose, Bld: 90 mg/dL (ref 70–99)
Potassium: 4.2 mmol/L (ref 3.5–5.1)
Sodium: 143 mmol/L (ref 135–145)
TOTAL PROTEIN: 7.1 g/dL (ref 6.5–8.1)

## 2018-02-17 LAB — I-STAT BETA HCG BLOOD, ED (MC, WL, AP ONLY): I-stat hCG, quantitative: <5 m[IU]/mL (ref <5)

## 2018-02-17 MED ORDER — ACETAZOLAMIDE ER 500 MG PO CP12: 500.0000 mg | ORAL_CAPSULE | Freq: Two times a day (BID) | ORAL | 0 refills | Status: DC

## 2018-02-17 MED ORDER — ACETAMINOPHEN 325 MG PO TABS
650.0000 mg | ORAL_TABLET | Freq: Once | ORAL | Status: AC
  Administered 2018-02-17: 650 mg via ORAL
  Filled 2018-02-17: qty 2

## 2018-02-17 NOTE — ED Notes (Signed)
Pt is highly anxious, has had MRI's for same, She has been googling symptoms and feels she is having a stroke or something. She went to [facility] and was told to come here.

## 2018-02-17 NOTE — ED Triage Notes (Addendum)
Pt reports that that over the summer started having right side facial tingling and tongue numbness and saw a neurologist and had MRI done which was clear. Pt reports that over past several been having headaches and lightheadedness.  Pt reports that she is scared and very anxious because she has been on [place]. Pt has equal facial symmetry and no neuro deficits at this time.

## 2018-02-17 NOTE — ED Notes (Signed)
To CT

## 2018-02-17 NOTE — ED Provider Notes (Signed)
[facility] DEPT Provider Note   CSN: [id number] Arrival date & time: 02/17/18  1503   History   Chief Complaint Chief Complaint  Patient presents with  . Headache  . Dizziness  . facial numbness    HPI Beverly Martinez is a 42 y.o. female with a past medical history significant for pseudotumor cerebri, numbness of tongue who presents for facial numbness and headache. Patient states that she has had facial numbness x5 months. Her numbness is located to the RLQ of her face and the lateral surface of her tongue. States that "I can feel on this area but it feels different." She was seen at her Neurologist at [facility] (patient unsure of provider name) for evaluation of this in June. Had an MRI brain with and without contrast, according to patient, which was negative. I could not find record of the MRI from this year however patient did receive an MR for this same issue in 2017 with negative findings. Patient states she "googled my symptoms and it says I am having a stroke." States "all I can do is focus on that Im dying all week long." States she has had a headache. Rates her pain a 2/10. Pain does not radiate. States this headache does not feel like her headache from her pseudotumor cerebri. 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 Admits to noncompliance with her Diamox.  Denies fever, chills, nausea, vomiting, facial asymmetry, slurred speech, unilateral weakness, vision changes, facial pain, facial swelling, eye pain.  HPI  Past Medical History:  Diagnosis Date  . Abnormal Pap smear   . Infection    UTI  . Pseudotumor cerebri     Patient Active Problem List   Diagnosis Date Noted  . Numbness of tongue 10/27/2014  . Tobacco abuse 10/27/2014  . Morbid obesity ([place]) 10/27/2014  . Elevated blood pressure 10/27/2014  . Missed abortion 07/03/2013  . ALLERGIC RHINITIS 05/22/2008  . DYSPNEA  05/22/2008    Past Surgical History:  Procedure Laterality Date  . CESAREAN SECTION    . COLPOSCOPY    . DILATION AND CURETTAGE OF UTERUS    . DILATION AND EVACUATION N/A 09/07/2013   Procedure: DILATATION AND EVACUATION;  Surgeon: Guss Bunde, MD;  Location: [facility];  Service: Gynecology;  Laterality: N/A;  . INDUCED ABORTION    . LUMBAR PUNCTURE    . WISDOM TOOTH EXTRACTION       OB History    Gravida  6   Para  2   Term      Preterm  2   AB  4   Living  2     SAB  2   TAB  2   Ectopic      Multiple      Live Births  2            Home Medications    Prior to Admission medications   Medication Sig Start Date End Date Taking? Authorizing Provider  aspirin EC 325 MG tablet Take 325 mg by mouth every 4 (four) hours as needed for moderate pain.   Yes [provider]  acetaZOLAMIDE (DIAMOX) 500 MG capsule Take 1 capsule (500 mg total) by mouth 2 (two) times daily for 14 days. 02/17/18 03/03/18  Dalayza Zambrana A, PA-C  fluticasone (FLONASE) 50 MCG/ACT nasal spray Place 2 sprays into both nostrils daily. Patient not taking: Reported on 01/30/2018 05/30/16  06/29/16  Kara Dies, NP  metroNIDAZOLE (FLAGYL) 500 MG tablet Take 1 tablet (500 mg total) by mouth 2 (two) times daily. Patient not taking: Reported on 01/30/2018 05/30/16   Kara Dies, NP  metroNIDAZOLE (FLAGYL) 500 MG tablet Take 1 tablet (500 mg total) by mouth 2 (two) times daily. Patient not taking: Reported on 01/30/2018 07/13/17   Constant, Peggy, MD  predniSONE (DELTASONE) 20 MG tablet Take 1 tablet (20 mg total) by mouth daily with breakfast. Patient not taking: Reported on 07/10/2017 06/27/17   Kara Dies, NP    Family History Family History  Problem Relation Age of Onset  . Hypertension Mother   . Cancer Maternal Grandmother        pancreatic  . Hypertension Maternal Grandmother     Social History Social History   Tobacco Use  . Smoking status:  Current Every Day Smoker    Types: Cigarettes    Last attempt to quit: 11/15/2014    Years since quitting: 3.2  . Smokeless tobacco: Never Used  Substance Use Topics  . Alcohol use: Yes    Alcohol/week: 0.0 standard drinks    Comment: occasional  . Drug use: No     Allergies   Patient has no known allergies.   Review of Systems Review of Systems  Constitutional: Negative for activity change, appetite change, chills, diaphoresis, fatigue, fever and unexpected weight change.  HENT: Negative for tinnitus and trouble swallowing.   Eyes: Negative.   Respiratory: Negative.   Cardiovascular: Negative.   Gastrointestinal: Negative.   Musculoskeletal: Negative for neck pain and neck stiffness.  Skin: Negative.   Neurological: Positive for numbness and headaches. Negative for dizziness, tremors, seizures, facial asymmetry, speech difficulty, weakness and light-headedness.     Physical Exam Updated Vital Signs BP 125/80 (BP Location: Left Arm)   Pulse 60   Temp 98.5 F (36.9 C) (Oral)   Resp 18   LMP 01/22/2018 (Approximate) Comment: NEG BETA HCG 02/17/18  SpO2 99%   Physical Exam  Physical Exam  Constitutional: Pt is oriented to person, place, and time. Pt appears well-developed and well-nourished. No distress.  HENT:  Head: Normocephalic and atraumatic.  Mouth/Throat: Oropharynx is clear and moist.  Eyes: Conjunctivae and EOM are normal. Pupils are equal, round, and reactive to light. No scleral icterus.  No horizontal, vertical or rotational nystagmus  Neck: Normal range of motion. Neck supple.  Full active and passive ROM without pain No midline or paraspinal tenderness No nuchal rigidity or meningeal signs  Cardiovascular: Normal rate, regular rhythm and intact distal pulses.   Pulmonary/Chest: Effort normal and breath sounds normal. No respiratory distress. Pt has no wheezes. No rales.  Abdominal: Soft. Bowel sounds are normal. There is no tenderness. There is no  rebound and no guarding.  Musculoskeletal: Normal range of motion.  Lymphadenopathy:    No cervical adenopathy.  Neurological: Pt. is alert and oriented to person, place, and time. He has normal reflexes. No cranial nerve deficit.  Exhibits normal muscle tone. Coordination normal.  Mental Status:  Alert, oriented, thought content appropriate. Speech fluent without evidence of aphasia. Able to follow 2 step commands without difficulty.  Cranial Nerves:  II:  Peripheral visual fields grossly normal, pupils equal, round, reactive to light III,IV, VI: ptosis not present, extra-ocular motions intact bilaterally  V,VII: smile symmetric, facial light touch sensation with abnormal sensation to right lower quadrant. VIII: hearing grossly normal bilaterally  IX,X: midline uvula rise  XI: bilateral shoulder shrug  equal and strong XII: midline tongue extension  Motor:  5/5 in upper and lower extremities bilaterally including strong and equal grip strength and dorsiflexion/plantar flexion Sensory: Pinprick and light touch normal in all extremities.  Deep Tendon Reflexes: 2+ and symmetric  Cerebellar: normal finger-to-nose with bilateral upper extremities Gait: normal gait and balance CV: distal pulses palpable throughout   Skin: Skin is warm and dry. No rash noted. Pt is not diaphoretic.  Psychiatric: Pt has a normal mood and affect. Behavior is normal. Judgment and thought content normal.  Nursing note and vitals reviewed. ED Treatments / Results  Labs (all labs ordered are listed, but only abnormal results are displayed) Labs Reviewed  CBC WITH DIFFERENTIAL/PLATELET  COMPREHENSIVE METABOLIC PANEL  I-STAT BETA HCG BLOOD, ED ([facility], [facility], [facility] ONLY)    EKG None  Radiology Ct Head Wo Contrast  Result Date: 02/17/2018 CLINICAL DATA:  Headache, acute. EXAM: CT HEAD WITHOUT CONTRAST TECHNIQUE: Contiguous axial images were obtained from the base of the skull through the vertex without intravenous  contrast. COMPARISON:  Brain MRI dated 11/16/2014. FINDINGS: Brain: Ventricles are normal in size and configuration. All areas of the brain demonstrate appropriate gray-white matter attenuation. There is no mass, hemorrhage, edema or other evidence of acute parenchymal abnormality. No extra-axial hemorrhage. Vascular: No hyperdense vessel or unexpected calcification. Skull: Normal. Negative for fracture or focal lesion. Sinuses/Orbits: No acute finding. Other: None. IMPRESSION: Normal head CT. Electronically Signed   By: Franki Cabot M.D.   On: 02/17/2018 17:47    Procedures Procedures (including critical care time)  Medications Ordered in ED Medications  acetaminophen (TYLENOL) tablet 650 mg (650 mg Oral Given 02/17/18 1721)     Initial Impression / Assessment and Plan / ED Course  I have reviewed the triage vital signs and the nursing notes.  Pertinent labs & imaging results that were available during my care of the patient were reviewed by me and considered in my medical decision making (see chart for details).  42 year old otherwise well-appearing female presents for evaluation of right lower quadrant facial paresthesias and headache. A Febrile, nonseptic, non-ill-appearing.  Patient with mild paresthesia to right lower quadrant of face on exam, remaining normal exam nonfocal without deficits.  Patient was seen for this issue 2017 and June 2019.  Has followed up with her neurologist at [facility] and received multiple MR brain with and without contrast with negative findings. Patient is extremely anxious on exam.  States she looked up her symptoms on Google and it told her she could be having a stroke or she may have a mass in her head causing the symptoms.  She was evaluated at urgent care and told she need to present to the emergency department for these symptoms.  States after she left urgent care she researched her symptoms again on Google and started to get a headache.  Rates her  headache a 2/10.  Patient does have history of pseudotumor cerebri, however states her headache does not feel like the head pain she gets when she has increased "fluid in my head."  Of note she has not been taking her Acetazolamide for over 2 years.  She has not had any issues in over 20 years with her pseudotumor cerebri.  Denies any facial pain, facial swelling, no facial droop.  Given patient anxiety will obtain labs and CT scan.  I feel her headache is most likely related to stress of her researching her facial paresthesias.  Low suspicion for pseudotumor cerebri headache given  patient's history and exam.  Low suspicion for CVA given her symptoms have been present for over 2 years and has had multiple negative MRIs with and without contrast and has been evaluated by her neurologist for this reason.  Symptoms remain unchanged since she was evaluated for this previously.  Will obtain labs, imaging and reevaluate.  Patient continues to be anxious throughout her stay about the abnormal sensation to the right lower quadrant of her face.  Thoroughly discussed with patient multiple times her negative lab work, negative CT scan as well as her previous MRI for this same issue.  Low suspicion for trigeminal neuralgia, Bell's palsy, infection or mass causing symptoms.  Labs without leukocytosis, CMP negative, CT negative.  On reevaluation prior to discharge patient that she is relieved that her results have been negative.  Discussed with patient she will need follow-up with her neurologist for possible additional testing, as she did not follow-up after her last negative MRI.  Discussed with patient reasons to return to the emergency department.  Patient voiced understanding is agreeable for follow-up.  This note was dictated with voice recognition software.  Despite best efforts at proofreading, errors may occur which can change the documentation meaning.    Final Clinical Impressions(s) / ED Diagnoses   Final  diagnoses:  Acute nonintractable headache, unspecified headache type  Facial neuropathy    ED Discharge Orders         Ordered    acetaZOLAMIDE (DIAMOX) 500 MG capsule  2 times daily     02/17/18 1754           Jerzey Komperda A, PA-C 02/17/18 1810    Milton Ferguson, MD 02/17/18 2154

## 2018-02-17 NOTE — ED Notes (Signed)
Pt continues to be anxious and voicing thoughts about serious illnesses and things that are wrong. (ie having a stroke, Bell's Palsy, other things she googled)

## 2018-02-17 NOTE — Discharge Instructions (Addendum)
Your evaluated today for facial tingling and mild headache.  Lab work was negative.  Your CT scan was negative.  Recommend following up with your neurologist for this issue.  I have refilled your Acetazolamide as you have requested.  Return to the emergency department with any new or worsening symptoms such as:  Contact a health care provider if: You develop symptoms that are different or more severe than your usual migraine symptoms. Get help right away if: Your migraine becomes severe. You have a fever. You have a stiff neck. You have vision loss. Your muscles feel weak or like you cannot control them. You start to lose your balance often. You develop trouble walking. You faint.

## 2018-03-12 ENCOUNTER — Other Ambulatory Visit (HOSPITAL_COMMUNITY): Payer: Self-pay | Admitting: *Deleted

## 2018-03-12 DIAGNOSIS — Z0000 Encounter for general adult medical examination without abnormal findings: Secondary | ICD-10-CM

## 2018-03-15 ENCOUNTER — Inpatient Hospital Stay: Payer: Self-pay

## 2018-03-15 ENCOUNTER — Inpatient Hospital Stay: Payer: Self-pay | Attending: Obstetrics and Gynecology | Admitting: *Deleted

## 2018-03-15 VITALS — BP 120/72 | Ht 63.0 in | Wt 252.0 lb

## 2018-03-15 DIAGNOSIS — Z0000 Encounter for general adult medical examination without abnormal findings: Secondary | ICD-10-CM

## 2018-03-15 LAB — LIPID PANEL
CHOLESTEROL: 186 mg/dL (ref 0–200)
HDL: 48 mg/dL (ref >40)
LDL Cholesterol: 118 mg/dL — ABNORMAL HIGH (ref 0–99)
Total CHOL/HDL Ratio: 3.9 RATIO
Triglycerides: 101 mg/dL (ref <150)
VLDL: 20 mg/dL (ref 0–40)

## 2018-03-15 LAB — HEMOGLOBIN A1C
HEMOGLOBIN A1C: 5.5 % (ref 4.8–5.6)
MEAN PLASMA GLUCOSE: 111.15 mg/dL

## 2018-03-15 NOTE — Addendum Note (Signed)
Addended by: Oliver Hum D on: 03/15/2018 04:01 PM   Modules accepted: Orders

## 2018-03-15 NOTE — Progress Notes (Signed)
[facility] initial screening  Clinical Measurement:  Height: 63in  Weight: 252lb Blood Pressure: 124/70  Blood Pressure #2:120/72  Fasting Labs Drawn Today, will review with patient when they result.  Medical History:  Patient states that she has not been diagnosed with high cholesterol, high blood pressure, diabetes or heart disease.  Medications:  Patients states she is not taking any medications for high cholesterol, high blood pressure or diabetes.  She is not taking aspirin daily to prevent heart attack or stroke.    Blood pressure, self measurement:  Patients states she does not measure blood pressure at home.    Nutrition:  Patient states she eats 0 cups of fruit and 1 cup of vegetables in an average day.  Patient states she does not eat fish regularly, she eats less than half a serving of whole grains daily. She drinks more than 36 ounces of beverages with added sugar weekly.  She is not  watching her sodium intake.  She has 1 drink containing alcohol in the last seven days.    Physical activity:  Patient states that she gets 0 minutes of moderate exercise in a week.  She gets 0 minutes of vigorous exercise per week.    Smoking status:  Patient states she has never smoked and is not around any smokers.    Quality of life:  Patient states that she has had 0 bad physical days out of the last 30 days. In the last 2 weeks, she has had 0 days that she has felt down or depressed. She has had 0 days in the last 2 weeks that she has had little interest or pleasure in doing things.  Risk reduction and counseling:  Patient states she wants to lose weight and increase fruit and vegetable intake.  I encouraged her to continue with current exercise regimen and increase vegetable and fruit intake.  Navigation:  I will notify patient of lab results.  Patient is aware of 2 more health coaching sessions and a follow up.

## 2018-03-18 ENCOUNTER — Telehealth (HOSPITAL_COMMUNITY): Payer: Self-pay | Admitting: *Deleted

## 2018-03-18 NOTE — Telephone Encounter (Signed)
Health coaching 2  Labs- LDL cholesterol 118, cholesterol 186, triglycerides 101, HDL cholesterol 48, hemoglobin A1C 5.5, mean plasma glucose 111.15  Patient is aware and understands these results.  Goals- Patient states that she wants to start an exercise regimen.  I encouraged patient to walk at least 3 to 5 times weekly for 30 minutes to equate 150 minutes/weekly.  Patient wants to increase intake of fruits and vegetables.  I told patient to eat at least 5 fruits and vegetables daily.  Navigation: Patient is aware of 1 more health coaching sessions and a follow up.  Time- 10 minutes

## 2018-04-08 ENCOUNTER — Telehealth: Payer: Self-pay

## 2018-04-08 NOTE — Telephone Encounter (Signed)
Health Coaching 3  Current:  Patient states that she is eating 1 serving of fruit (pears) per day and 2 servings of vegetables (greens, tomatoes, potatoes, lettuce, onions) per day.  Patient states that she occasionally has a serving of whole grains in the form of popcorn.  Patient states that she has fish once every two weeks (tilapia, baked salmon).  Patient states that she drinks sugar drinks everyday, all day.  Patient states that she drinks 3-4 bottles of water (16oz bottles) per day.  Patient states that she eats fried foods every day.  Patient states that she occasionally has physical activity in the form of dancing.  Patient states that she very occasionally has a cigarette, maybe 2-3 per month.  When I mentioned our smoking cessation classes, patient states that she doesn't consider herself a smoker.  New Goal:    Goal #1: Patient states that her goal is to switch from sodas to carbonated water without sugar.  Barrier(s) to reaching goal:  Patient states that she just bought several cases of blackberry ginger ale and would like to start working on this goal after the holidays.  We were disconnected before we could finish our health coaching session.  Strategies to overcome barrier(s):    Confidence Level for achieving goal (1-10):    Navigation:  Patient is aware of a follow-up session, said that we can call her anytime.  Time:  21 minutes

## 2018-04-08 NOTE — Telephone Encounter (Signed)
Call is regarding Beverly Martinez's 3rd health coaching session for [facility].

## 2018-08-31 ENCOUNTER — Other Ambulatory Visit: Payer: Self-pay

## 2018-08-31 ENCOUNTER — Emergency Department (HOSPITAL_COMMUNITY): Payer: HRSA Program

## 2018-08-31 ENCOUNTER — Encounter (HOSPITAL_COMMUNITY): Payer: Self-pay | Admitting: *Deleted

## 2018-08-31 ENCOUNTER — Emergency Department (HOSPITAL_COMMUNITY)
Admission: EM | Admit: 2018-08-31 | Discharge: 2018-08-31 | Disposition: A | Discharge status: Home / Self Care | Payer: HRSA Program | Attending: Emergency Medicine | Admitting: Emergency Medicine

## 2018-08-31 DIAGNOSIS — R05 Cough: Secondary | ICD-10-CM | POA: Insufficient documentation

## 2018-08-31 DIAGNOSIS — Z20828 Contact with and (suspected) exposure to other viral communicable diseases: Secondary | ICD-10-CM | POA: Insufficient documentation

## 2018-08-31 DIAGNOSIS — Z87891 Personal history of nicotine dependence: Secondary | ICD-10-CM | POA: Insufficient documentation

## 2018-08-31 DIAGNOSIS — Z79899 Other long term (current) drug therapy: Secondary | ICD-10-CM | POA: Diagnosis not present

## 2018-08-31 DIAGNOSIS — J069 Acute upper respiratory infection, unspecified: Secondary | ICD-10-CM | POA: Diagnosis not present

## 2018-08-31 DIAGNOSIS — B9789 Other viral agents as the cause of diseases classified elsewhere: Secondary | ICD-10-CM

## 2018-08-31 DIAGNOSIS — R42 Dizziness and giddiness: Secondary | ICD-10-CM | POA: Diagnosis present

## 2018-08-31 LAB — CBC
HCT: 40.7 % (ref 36.0–46.0)
Hemoglobin: 12.7 g/dL (ref 12.0–15.0)
MCH: 28.6 pg (ref 26.0–34.0)
MCHC: 31.2 g/dL (ref 30.0–36.0)
MCV: 91.7 fL (ref 80.0–100.0)
Platelets: 288 10*3/uL (ref 150–400)
RBC: 4.44 MIL/uL (ref 3.87–5.11)
RDW: 13.4 % (ref 11.5–15.5)
WBC: 6.6 10*3/uL (ref 4.0–10.5)
nRBC: 0 % (ref 0.0–0.2)

## 2018-08-31 LAB — BASIC METABOLIC PANEL
Anion gap: 7 (ref 5–15)
BUN: 13 mg/dL (ref 6–20)
CO2: 27 mmol/L (ref 22–32)
Calcium: 9.3 mg/dL (ref 8.9–10.3)
Chloride: 104 mmol/L (ref 98–111)
Creatinine, Ser: 0.87 mg/dL (ref 0.44–1.00)
GFR calc Af Amer: >60 mL/min (ref >60)
GFR calc non Af Amer: >60 mL/min (ref >60)
Glucose, Bld: 98 mg/dL (ref 70–99)
Potassium: 3.8 mmol/L (ref 3.5–5.1)
Sodium: 138 mmol/L (ref 135–145)

## 2018-08-31 NOTE — ED Notes (Signed)
Pt updated on plan of care and reason for wait. Pt verbalizes understanding.

## 2018-08-31 NOTE — ED Triage Notes (Signed)
Pt states she has a cough at night for a couple of weeks. Sometimes productive, tickling in her throat, light headed yesterday. Pt extremely anxious and is aware but unable to calm down during assessment. She has been around Covid 19 positive pts. No fevers when she checks daily.

## 2018-08-31 NOTE — Discharge Instructions (Signed)
Control Measures  Patients who have symptoms consistent with COVID-19 should self-isolate for:  -At least 3 days (72 hours) have passed since recovery defined as resolution of fever without the use of fever-reducing medications and improvement in respiratory symptoms (e.g., cough, shortness of breath)  AND  -At least 7 days have passed since symptoms first appeared.  Close contacts of a person with known or suspected COVID-19 should self-monitor their temperature and symptoms of COVID-19, limit outside interaction as much as possible for 14 days, and self-isolate if they develop symptoms.

## 2018-08-31 NOTE — ED Provider Notes (Signed)
[facility] DEPT Provider Note   CSN: [id number] Arrival date & time: 08/31/18  7209    History   Chief Complaint Chief Complaint  Patient presents with  . Light Headed Feeling  . Cough    HPI Beverly Martinez is a 42 y.o. female.     HPI Patient is a 43 year old female presents the emergency department with complaints of cough intermittently over the past several weeks.  She reported some lightheadedness today.  She denies chest pain.  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  She denies fevers.  She has been checking her fever daily.  Because she felt slightly lightheaded today without palpitation she came to the ER for evaluation.  She reports "I am freaking out".   Past Medical History:  Diagnosis Date  . Abnormal Pap smear   . Infection    UTI  . Pseudotumor cerebri     Patient Active Problem List   Diagnosis Date Noted  . Numbness of tongue 10/27/2014  . Tobacco abuse 10/27/2014  . Morbid obesity ([place]) 10/27/2014  . Elevated blood pressure 10/27/2014  . Missed abortion 07/03/2013  . ALLERGIC RHINITIS 05/22/2008  . DYSPNEA 05/22/2008    Past Surgical History:  Procedure Laterality Date  . CESAREAN SECTION    . COLPOSCOPY    . DILATION AND CURETTAGE OF UTERUS    . DILATION AND EVACUATION N/A 09/07/2013   Procedure: DILATATION AND EVACUATION;  Surgeon: Guss Bunde, MD;  Location: [facility];  Service: Gynecology;  Laterality: N/A;  . INDUCED ABORTION    . LUMBAR PUNCTURE    . WISDOM TOOTH EXTRACTION       OB History    Gravida  6   Para  2   Term      Preterm  2   AB  4   Living  2     SAB  2   TAB  2   Ectopic      Multiple      Live Births  2            Home Medications    Prior to Admission medications   Medication Sig Start Date End Date Taking? Authorizing Provider  Ascorbic Acid (VITAMIN C PO) Take 1 tablet by mouth daily.    Yes [provider]  gabapentin (NEURONTIN) 400 MG capsule Take 400 mg by mouth 2 (two) times a day. 07/30/18  Yes [provider]  TURMERIC PO Take 1 tablet by mouth daily.   Yes [provider]  acetaZOLAMIDE (DIAMOX) 500 MG capsule Take 1 capsule (500 mg total) by mouth 2 (two) times daily for 14 days. Patient not taking: Reported on 08/31/2018 02/17/18 03/03/18  Henderly, Britni A, PA-C    Family History Family History  Problem Relation Age of Onset  . Hypertension Mother   . Cancer Maternal Grandmother        pancreatic  . Hypertension Maternal Grandmother     Social History Social History   Tobacco Use  . Smoking status: Former Smoker    Types: Cigarettes    Last attempt to quit: 11/15/2014    Years since quitting: 3.7  . Smokeless tobacco: Never Used  Substance Use Topics  . Alcohol use: Yes    Alcohol/week: 0.0 standard drinks    Comment: occasional  . Drug use: No     Allergies   Patient has no known allergies.  Review of Systems Review of Systems  All other systems reviewed and are negative.    Physical Exam Updated Vital Signs BP (!) 118/54 (BP Location: Left Arm)   Pulse (!) 59   Temp 98.3 F (36.8 C) (Oral)   Resp 15   Ht 5' 3.5" (1.613 m)   Wt 111.6 kg   LMP 08/17/2018   SpO2 100%   BMI 42.89 kg/m   Physical Exam Vitals signs and nursing note reviewed.  Constitutional:      General: She is not in acute distress.    Appearance: She is well-developed.  HENT:     Head: Normocephalic and atraumatic.  Neck:     Musculoskeletal: Normal range of motion.  Cardiovascular:     Rate and Rhythm: Normal rate and regular rhythm.     Heart sounds: Normal heart sounds.  Pulmonary:     Effort: Pulmonary effort is normal.     Breath sounds: Normal breath sounds.  Abdominal:     General: There is no distension.     Palpations: Abdomen is soft.     Tenderness: There is no abdominal tenderness.  Musculoskeletal: Normal  range of motion.  Skin:    General: Skin is warm and dry.  Neurological:     Mental Status: She is alert and oriented to person, place, and time.  Psychiatric:        Judgment: Judgment normal.      ED Treatments / Results  Labs (all labs ordered are listed, but only abnormal results are displayed) Labs Reviewed  NOVEL CORONAVIRUS, NAA (HOSPITAL ORDER, SEND-OUT TO REF LAB)  CBC  BASIC METABOLIC PANEL    EKG None  Radiology Dg Chest Portable 1 View  Result Date: 08/31/2018 CLINICAL DATA:  43 year old female with history of cough for the past couple of weeks. Contact with COVID-19 positive patients. EXAM: PORTABLE CHEST 1 VIEW COMPARISON:  Chest x-ray 05/22/2008. FINDINGS: Lung volumes are normal. No consolidative airspace disease. No pleural effusions. No pneumothorax. No pulmonary nodule or mass noted. Pulmonary vasculature and the cardiomediastinal silhouette are within normal limits. IMPRESSION: No radiographic evidence of acute cardiopulmonary disease. Electronically Signed   By: Vinnie Langton M.D.   On: 08/31/2018 11:24    Procedures Procedures (including critical care time)  Medications Ordered in ED Medications - No data to display   Initial Impression / Assessment and Plan / ED Course  I have reviewed the triage vital signs and the nursing notes.  Pertinent labs & imaging results that were available during my care of the patient were reviewed by me and considered in my medical decision making (see chart for details).        Work-up in the emergency department out significant abnormality.  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  It would be beneficial for her to know her status.  No indication for additional work-up.  Well-appearing.   Final Clinical Impressions(s) / ED Diagnoses   Final diagnoses:  Viral URI with cough    ED Discharge Orders    None        Jola Schmidt, MD 08/31/18 1151

## 2018-08-31 NOTE — ED Notes (Signed)
MD at bedside. 

## 2018-09-01 LAB — SARS CORONAVIRUS 2 BY RT PCR (HOSPITAL ORDER, PERFORMED IN ~~LOC~~ HOSPITAL LAB): SARS Coronavirus 2: NEGATIVE

## 2018-10-30 ENCOUNTER — Telehealth: Payer: Self-pay

## 2018-10-30 NOTE — Telephone Encounter (Signed)
I called patient and  Asked her to come on 10/31/2018 for the PPD test, as we are going to be closed on the weekend and we wont be able to read it, patient understood and agreed.

## 2018-11-04 ENCOUNTER — Ambulatory Visit: Payer: Self-pay | Admitting: Nurse Practitioner

## 2018-11-04 ENCOUNTER — Other Ambulatory Visit: Payer: Self-pay

## 2018-11-04 DIAGNOSIS — Z111 Encounter for screening for respiratory tuberculosis: Secondary | ICD-10-CM

## 2018-11-06 LAB — TB SKIN TEST
Induration: 0 mm
TB Skin Test: NEGATIVE

## 2018-12-04 ENCOUNTER — Other Ambulatory Visit: Payer: Self-pay

## 2018-12-04 DIAGNOSIS — Z20822 Contact with and (suspected) exposure to covid-19: Secondary | ICD-10-CM

## 2018-12-06 LAB — NOVEL CORONAVIRUS, NAA: SARS-CoV-2, NAA: NOT DETECTED

## 2019-03-13 ENCOUNTER — Other Ambulatory Visit: Payer: Self-pay

## 2019-03-13 DIAGNOSIS — Z20822 Contact with and (suspected) exposure to covid-19: Secondary | ICD-10-CM

## 2019-03-14 LAB — NOVEL CORONAVIRUS, NAA: SARS-CoV-2, NAA: NOT DETECTED

## 2019-03-20 ENCOUNTER — Other Ambulatory Visit (HOSPITAL_COMMUNITY): Payer: Self-pay | Admitting: *Deleted

## 2019-03-20 DIAGNOSIS — Z1231 Encounter for screening mammogram for malignant neoplasm of breast: Secondary | ICD-10-CM

## 2019-05-06 ENCOUNTER — Ambulatory Visit (HOSPITAL_COMMUNITY): Payer: PRIVATE HEALTH INSURANCE

## 2019-05-13 ENCOUNTER — Other Ambulatory Visit: Payer: Self-pay

## 2019-05-13 ENCOUNTER — Emergency Department (HOSPITAL_COMMUNITY)
Admission: EM | Admit: 2019-05-13 | Discharge: 2019-05-13 | Discharge status: Absent without leave | Payer: Medicaid Other

## 2019-06-23 ENCOUNTER — Other Ambulatory Visit: Payer: Self-pay

## 2019-06-23 DIAGNOSIS — Z1231 Encounter for screening mammogram for malignant neoplasm of breast: Secondary | ICD-10-CM

## 2019-07-24 ENCOUNTER — Ambulatory Visit: Payer: PRIVATE HEALTH INSURANCE

## 2019-08-12 ENCOUNTER — Ambulatory Visit: Payer: PRIVATE HEALTH INSURANCE

## 2019-08-28 ENCOUNTER — Other Ambulatory Visit: Payer: Self-pay

## 2019-08-28 ENCOUNTER — Ambulatory Visit: Payer: PRIVATE HEALTH INSURANCE

## 2019-08-28 DIAGNOSIS — Z1231 Encounter for screening mammogram for malignant neoplasm of breast: Secondary | ICD-10-CM

## 2019-11-24 IMAGING — CT CT RENAL STONE PROTOCOL
2 of 4 series · 16 of 46 positions shown, 18 images · non-contrast
Comparison: None.

CLINICAL DATA: Flank pain

EXAM:
CT ABDOMEN AND PELVIS WITHOUT CONTRAST
TECHNIQUE: Multidetector CT imaging of the abdomen and pelvis was performed
following the standard protocol without oral or IV contrast.

[Series 2: axial st · axial · 0.66mm/px · z∈[-504,-104]mm · 13 of 90 slices shown, 15 images]
[im 5/90  soft-tissue]
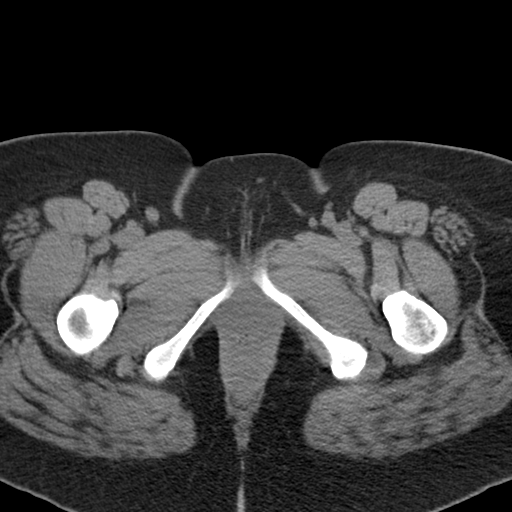
[im 5/90  bone]
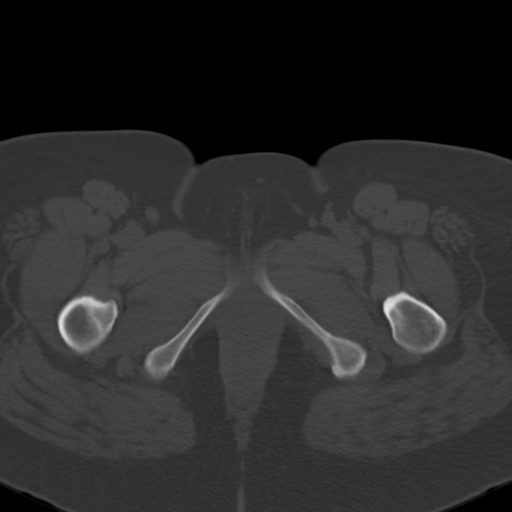
[im 13/90  soft-tissue]
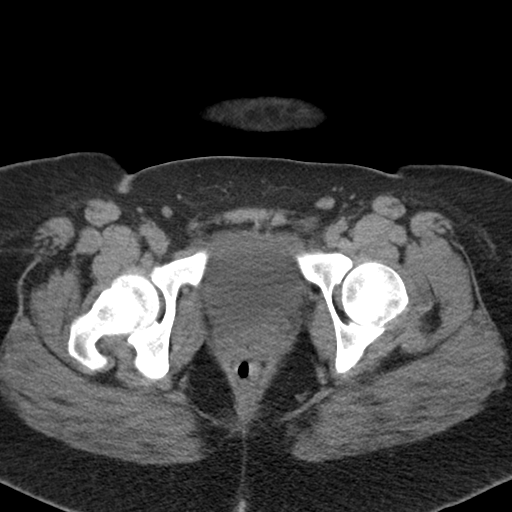
[im 17/90  soft-tissue]
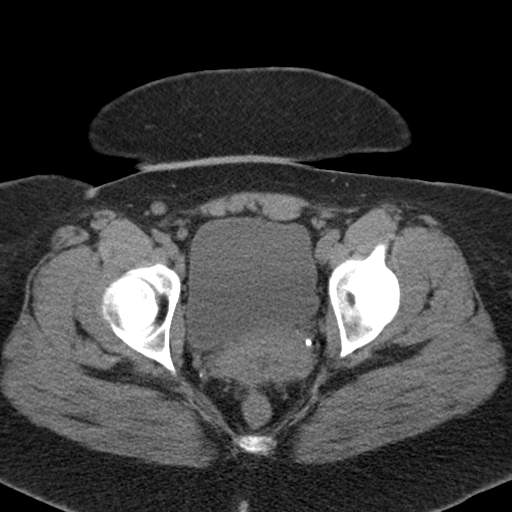
[im 26/90  soft-tissue]
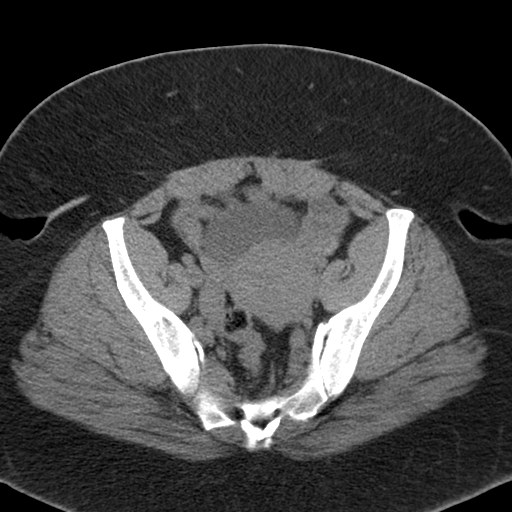
[im 30/90  soft-tissue]
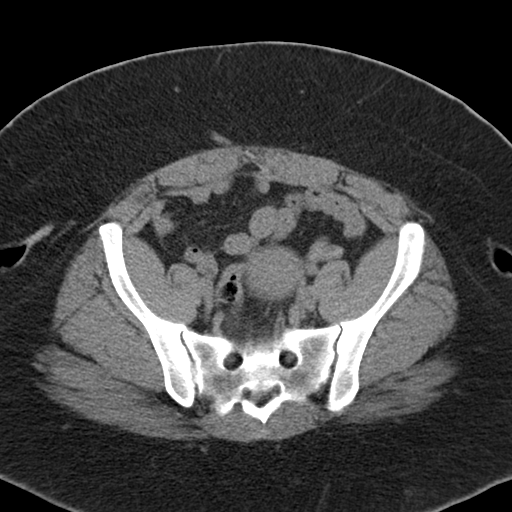
[im 39/90  soft-tissue]
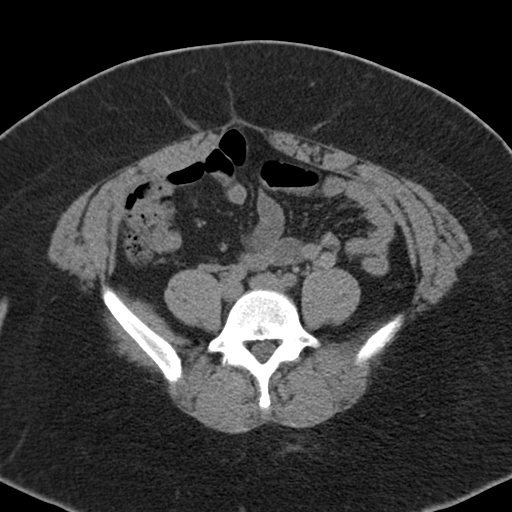
[im 47/90  soft-tissue]
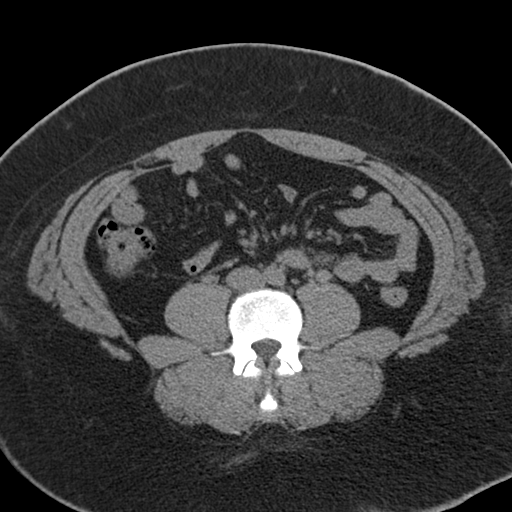
[im 51/90  soft-tissue]
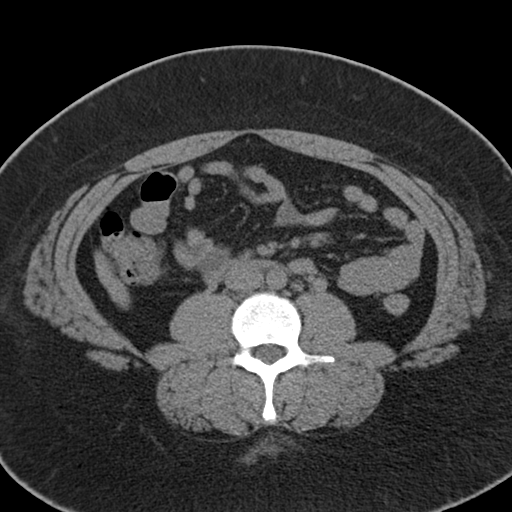
[im 60/90  soft-tissue]
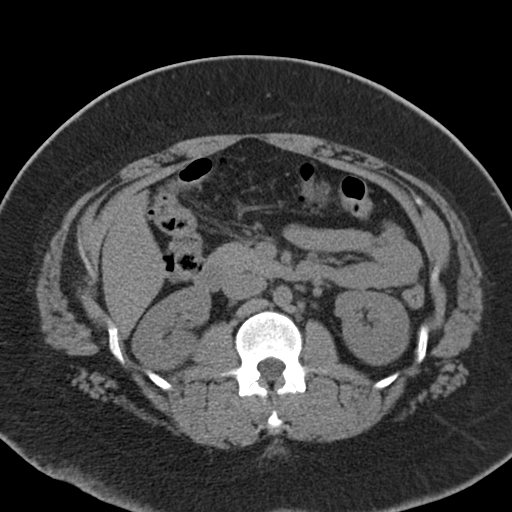
[im 60/90  bone]
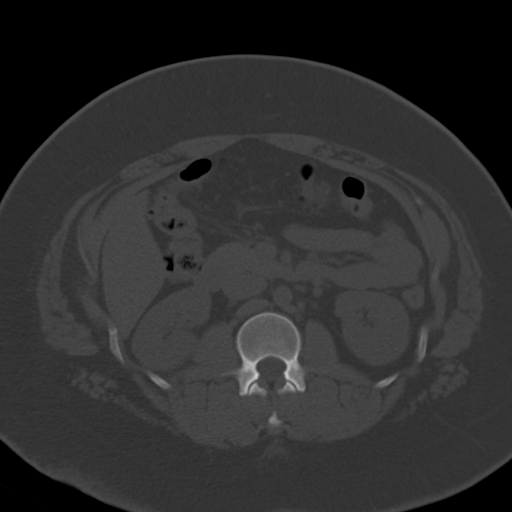
[im 64/90  soft-tissue]
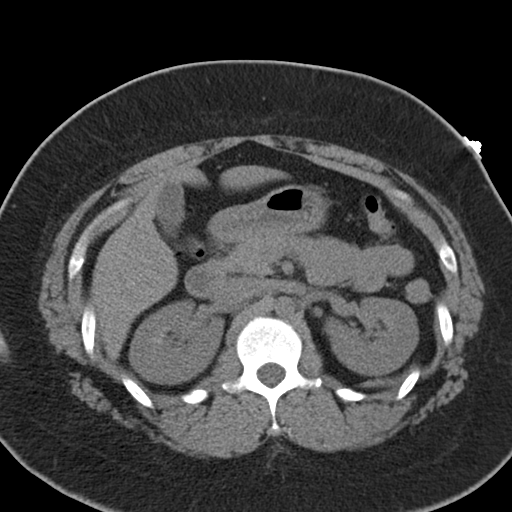
[im 73/90  soft-tissue]
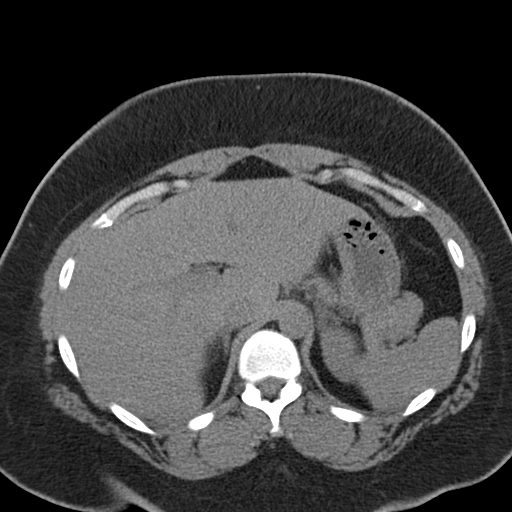
[im 77/90  soft-tissue]
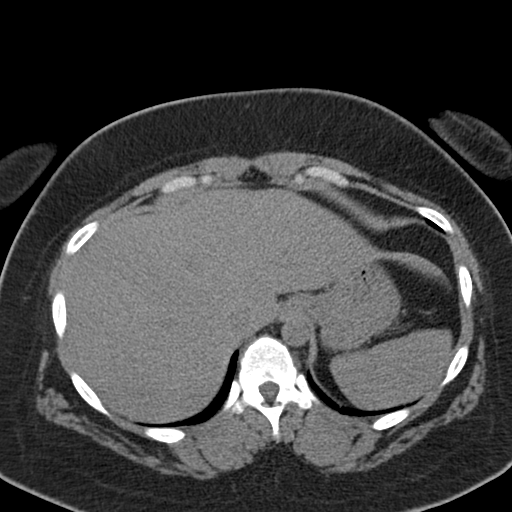
[im 85/90  soft-tissue]
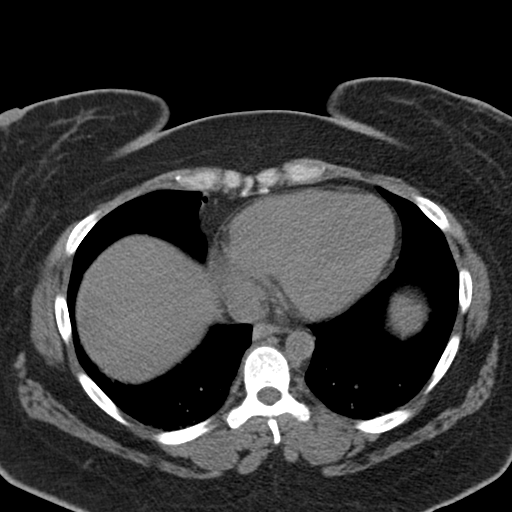

[Series 5: coronal · coronal · 0.65mm/px · 3 of 142 slices shown]
[im 48/142  soft-tissue]
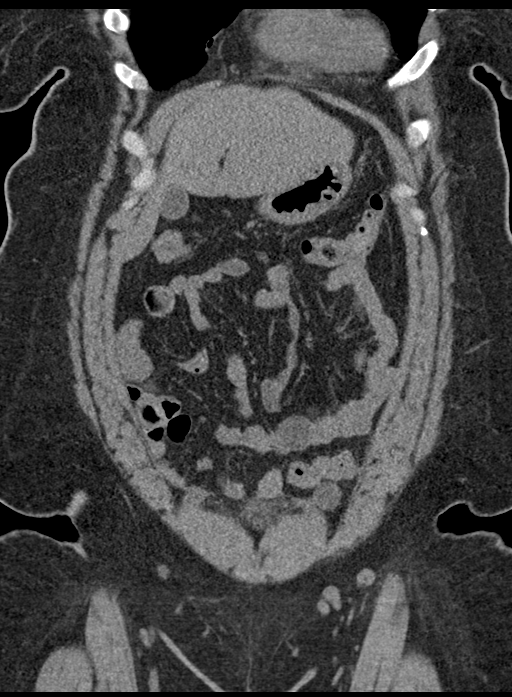
[im 63/142  soft-tissue]
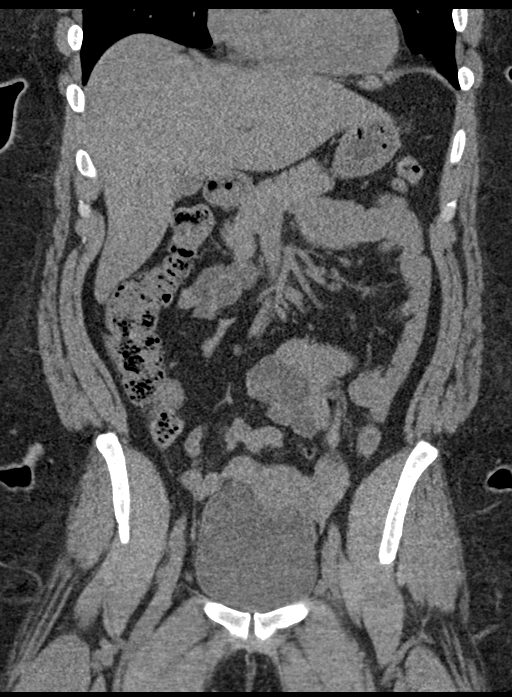
[im 79/142  soft-tissue]
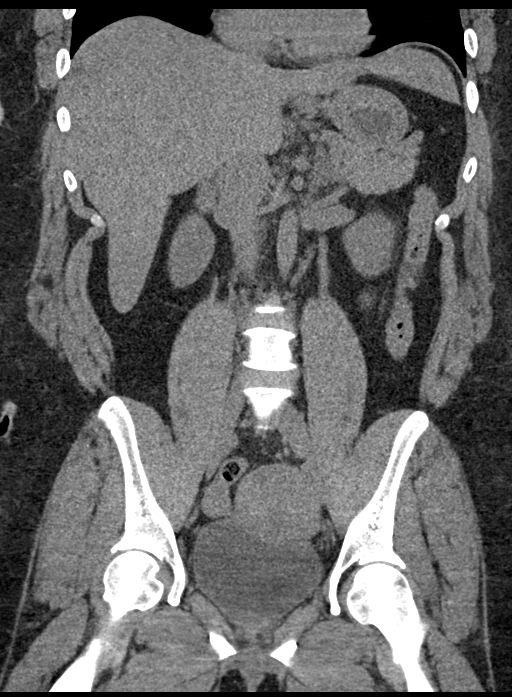

[16 of 46 positions shown; findings below may reference images not displayed]

FINDINGS: Lower chest: There is bibasilar atelectatic change. No lung base
edema or consolidation.

Hepatobiliary: No focal liver lesions are evident on this
noncontrast enhanced study. Gallbladder wall is not appreciably
thickened. There is no biliary duct dilatation.

Pancreas: No pancreatic mass or inflammatory focus.

Spleen: No splenic lesions are evident.

Adrenals/Urinary Tract: Adrenals bilaterally appear unremarkable.
Kidneys bilaterally show no evident mass or hydronephrosis on either
side. There is a 2 mm calculus in the lower pole left kidney,
nonobstructing. There is no evident ureteral calculus on either
side. Urinary bladder is midline with wall thickness within normal
limits.

Stomach/Bowel: There is no appreciable bowel wall or mesenteric
thickening. No evident bowel obstruction. No free air or portal
venous air.

Vascular/Lymphatic: No abdominal aortic aneurysm. No vascular
lesions are evident on this noncontrast enhanced study. There is no
appreciable adenopathy in the abdomen or pelvis.

Reproductive: Uterus is anteverted. There are small apparent
nabothian cysts in the cervix. Beyond the small cervical nabothian
cysts, there is no evident pelvic mass.

Other: Appendix appears unremarkable. There is no ascites or abscess
in the abdomen or pelvis. There is thinning of the midline rectus
muscle.

Musculoskeletal: There are no blastic or lytic bone lesions. No
intramuscular lesions are evident.
IMPRESSION: 1. 2 mm nonobstructing calculus lower pole left kidney. No evident
hydronephrosis or ureteral calculus on either side.

2. No bowel obstruction. No abscess in the abdomen or pelvis.
Appendix appears normal.

3.  Small cervical nabothian cysts noted.

## 2019-11-25 IMAGING — US ULTRASOUND RIGHT BREAST LIMITED
1 series · 5 of 5 positions shown · non-contrast
Comparison: Mammography 05/31/2017 (RIGHT), 11/16/2016 (RIGHT),
11/14/2016 (BILATERAL, baseline).

CLINICAL DATA: Greater than 1 year interval follow-up of a likely
benign intramammary lymph node in the UPPER OUTER QUADRANT of the
RIGHT breast identified on baseline screening mammography. Annual
evaluation, LEFT breast.

EXAM:
DIGITAL DIAGNOSTIC BILATERAL MAMMOGRAM WITH CAD AND TOMO
ULTRASOUND RIGHT BREAST

[Series 1: ultrasound right breast limited · 0.06mm/px · 5 of 5 slices shown]
[im 1/5]
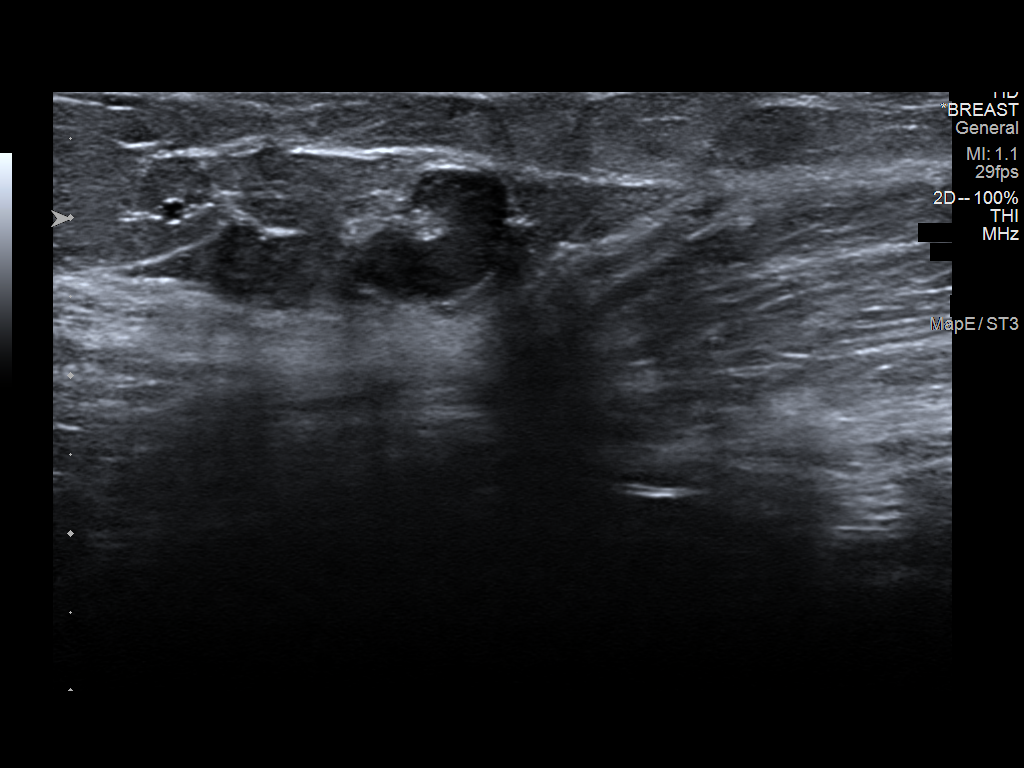
[im 2/5]
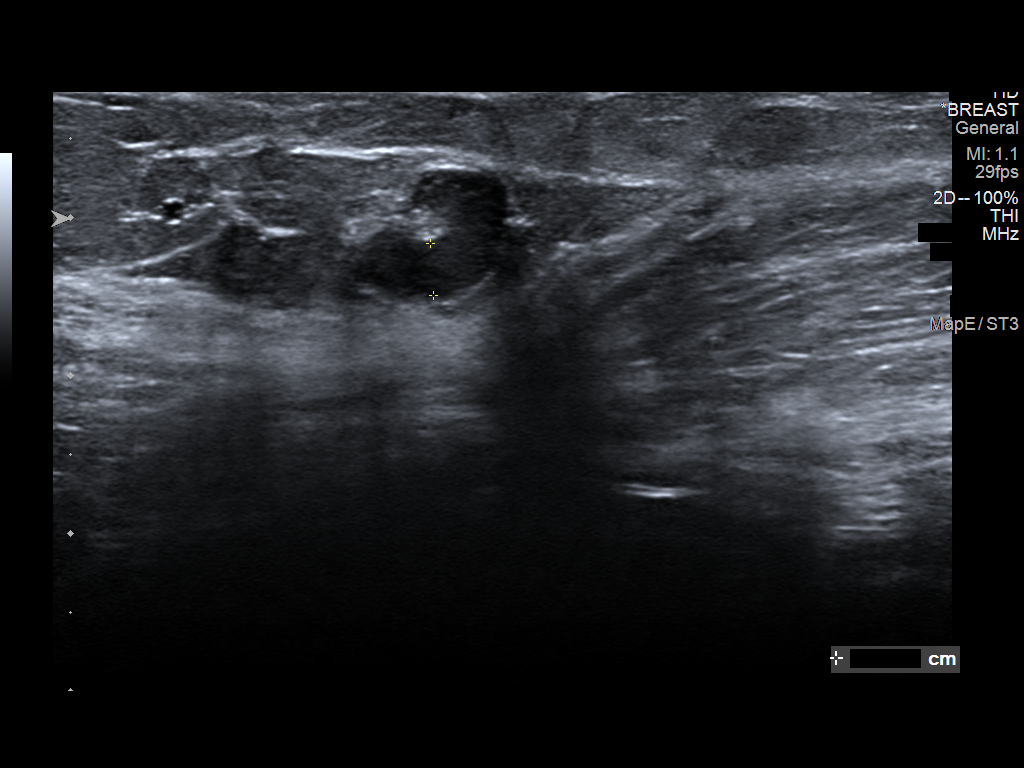
[im 3/5]
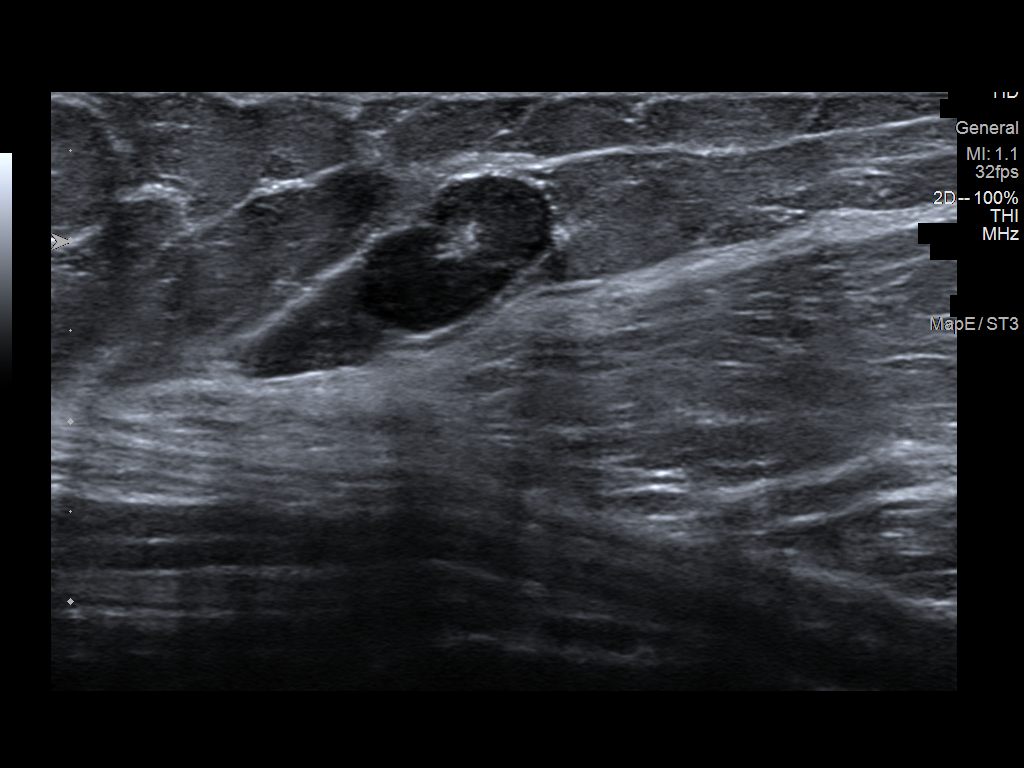
[im 4/5]
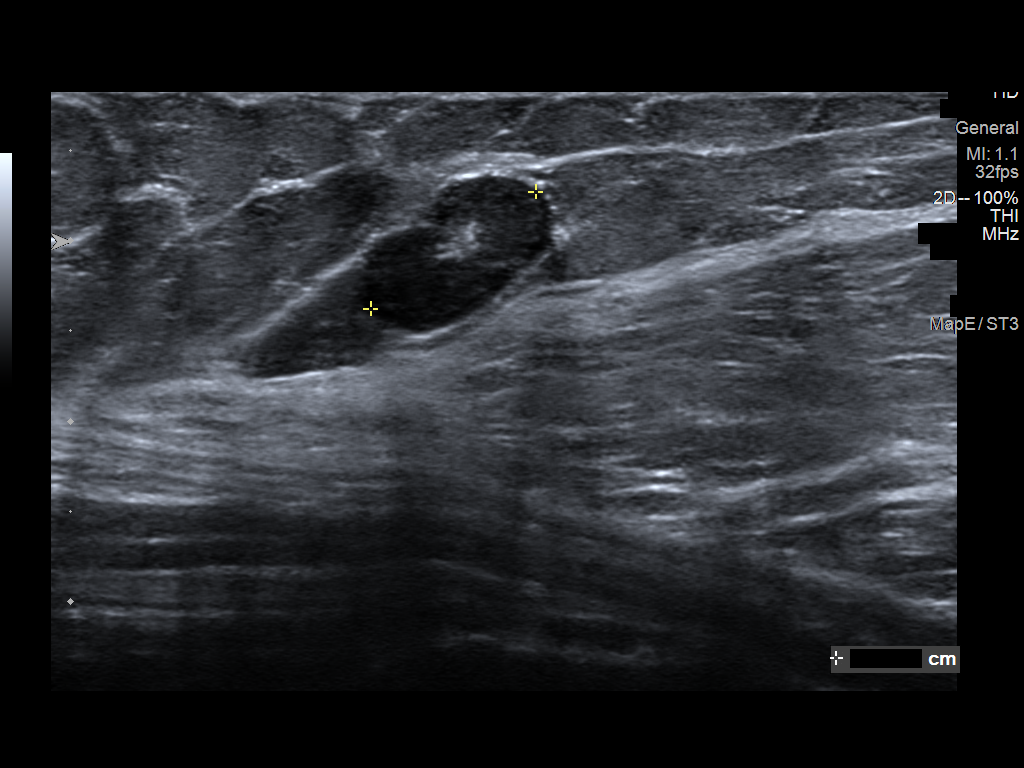
[im 5/5]
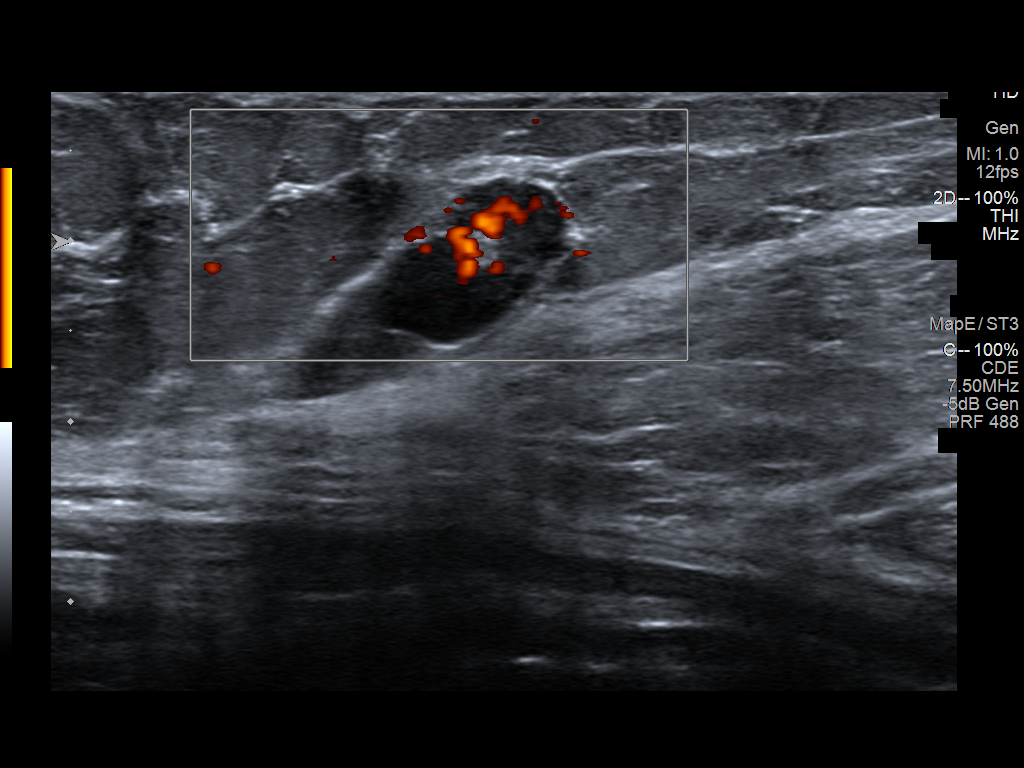

[5 of 5 positions shown; findings below may reference images not displayed]

RIGHT breast ultrasound
11/16/2016.

ACR Breast Density Category a: The breast tissue is almost entirely
fatty.
FINDINGS: Tomosynthesis and synthesized full field CC and MLO views of both
breasts were obtained.

The circumscribed medium density mass with a central fatty notch in
the UPPER OUTER QUADRANT of the RIGHT breast at POSTERIOR depth is
unchanged in size and appearance dating back to the 11/14/2016
examination, measuring approximately 1.2 x 1.0 x 1.3 cm. There is no
associated architectural distortion or suspicious calcifications. No
new or suspicious findings elsewhere in the RIGHT breast.

No findings suspicious for malignancy in the LEFT breast.

Mammographic images were processed with CAD.

Targeted RIGHT breast ultrasound is performed, showing the
previously identified circumscribed oval parallel hypoechoic mass
with central fat indicating a lymph node at the 10 o'clock position
approximately 13 cm measuring approximately 1.1 cm with a normal
cortical thickness of 3 mm. A normal vessel entering fatty hilus is
identified power Doppler evaluation. The lymph node is unchanged
since the prior ultrasound.
IMPRESSION: 1. No mammographic or sonographic evidence of malignancy involving
the RIGHT breast.
2. Stable benign 1.1 cm intramammary lymph node in the UPPER OUTER
QUADRANT of the RIGHT breast.
3. No mammographic evidence of malignancy involving the LEFT breast.

RECOMMENDATION:
Screening mammogram in one year.(Code:[id number])

I have discussed the findings and recommendations with the patient.
Results were also provided in writing at the conclusion of the
visit. If applicable, a reminder letter will be sent to the patient
regarding the next appointment.

BI-RADS CATEGORY  2: Benign.

## 2020-06-07 ENCOUNTER — Ambulatory Visit (HOSPITAL_COMMUNITY)
Admission: EM | Admit: 2020-06-07 | Discharge: 2020-06-07 | Disposition: A | Discharge status: Home / Self Care | Payer: Medicaid Other

## 2020-06-07 ENCOUNTER — Other Ambulatory Visit: Payer: Self-pay

## 2020-06-07 ENCOUNTER — Encounter (HOSPITAL_COMMUNITY): Payer: Self-pay | Admitting: Emergency Medicine

## 2020-06-07 DIAGNOSIS — N939 Abnormal uterine and vaginal bleeding, unspecified: Secondary | ICD-10-CM

## 2020-06-07 NOTE — Discharge Instructions (Signed)
I did not see or feel any tampon to the vagina today.  Please return or follow with gynecology for any new or worsening of symptoms.

## 2020-06-07 NOTE — ED Triage Notes (Signed)
Pt presents with possibly 2 tampons in vagina. States thinks this happened about 3 days ago.

## 2020-06-07 NOTE — ED Provider Notes (Signed)
[facility]    CSN: [id number] Arrival date & time: 06/07/20  6237      History   Chief Complaint Chief Complaint  Patient presents with  . Foreign Body in Vagina    HPI Berthoud is a 45 y.o. female.   Beverly Martinez presents with concerns that she may have two tampons still with the vagina. She has been on her period. She felt that her bleeding was maybe heavier than usual. Three days ago she inserted a tampon. Still with bleeding so she inserted another tampon. She has since not been able to find these to remove them so she presents in concern that they are still in the vagina. No pelvic pain. No odor. No fevers. Denies any previous similar. She has not had sex since inserting the tampons. Bleeding has nearly subsided.    ROS per HPI, negative if not otherwise mentioned.      Past Medical History:  Diagnosis Date  . Abnormal Pap smear   . Infection    UTI  . Pseudotumor cerebri     Patient Active Problem List   Diagnosis Date Noted  . Numbness of tongue 10/27/2014  . Tobacco abuse 10/27/2014  . Morbid obesity ([place]) 10/27/2014  . Elevated blood pressure 10/27/2014  . Missed abortion 07/03/2013  . ALLERGIC RHINITIS 05/22/2008  . DYSPNEA 05/22/2008    Past Surgical History:  Procedure Laterality Date  . CESAREAN SECTION    . COLPOSCOPY    . DILATION AND CURETTAGE OF UTERUS    . DILATION AND EVACUATION N/A 09/07/2013   Procedure: DILATATION AND EVACUATION;  Surgeon: Guss Bunde, MD;  Location: [facility];  Service: Gynecology;  Laterality: N/A;  . INDUCED ABORTION    . LUMBAR PUNCTURE    . WISDOM TOOTH EXTRACTION      OB History    Gravida  6   Para  2   Term      Preterm  2   AB  4   Living  2     SAB  2   IAB  2   Ectopic      Multiple      Live Births  2            Home Medications    Prior to Admission medications   Medication Sig Start Date End Date Taking? Authorizing Provider   acetaZOLAMIDE (DIAMOX) 500 MG capsule Take 1 capsule (500 mg total) by mouth 2 (two) times daily for 14 days. Patient not taking: Reported on 08/31/2018 02/17/18 03/03/18  Henderly, Britni A, PA-C  Ascorbic Acid (VITAMIN C PO) Take 1 tablet by mouth daily.    [provider]  gabapentin (NEURONTIN) 400 MG capsule Take 400 mg by mouth 2 (two) times a day. 07/30/18   [provider]  TURMERIC PO Take 1 tablet by mouth daily.    [provider]    Family History Family History  Problem Relation Age of Onset  . Hypertension Mother   . Cancer Maternal Grandmother        pancreatic  . Hypertension Maternal Grandmother     Social History Social History   Tobacco Use  . Smoking status: Former Smoker    Types: Cigarettes    Quit date: 11/15/2014    Years since quitting: 5.5  . Smokeless tobacco: Never Used  Vaping Use  . Vaping Use: Never used  Substance Use Topics  . Alcohol use: Yes  Alcohol/week: 0.0 standard drinks    Comment: occasional  . Drug use: No     Allergies   Patient has no known allergies.   Review of Systems Review of Systems   Physical Exam Triage Vital Signs ED Triage Vitals  Enc Vitals Group     BP 06/07/20 1054 129/79     Pulse Rate 06/07/20 1054 62     Resp 06/07/20 1054 19     Temp 06/07/20 1054 98 F (36.7 C)     Temp Source 06/07/20 1054 Oral     SpO2 06/07/20 1054 100 %     Weight --      Height --      Head Circumference --      Peak Flow --      Pain Score 06/07/20 1053 0     Pain Loc --      Pain Edu? --      Excl. in [place]? --    No data found.  Updated Vital Signs BP 129/79 (BP Location: Left Arm)   Pulse 62   Temp 98 F (36.7 C) (Oral)   Resp 19   LMP 06/04/2020   SpO2 100%   Visual Acuity Right Eye Distance:   Left Eye Distance:   Bilateral Distance:    Right Eye Near:   Left Eye Near:    Bilateral Near:     Physical Exam Constitutional:      General: She is not in acute distress.     Appearance: She is well-developed.  Cardiovascular:     Rate and Rhythm: Normal rate.  Pulmonary:     Effort: Pulmonary effort is normal.  Genitourinary:    Vagina: No foreign body. No prolapsed vaginal walls.     Cervix: Normal.     Comments: Scant dark red blood from cervix and within vagina; no visible or palpable tampon or other foreign body  Skin:    General: Skin is warm and dry.  Neurological:     Mental Status: She is alert and oriented to person, place, and time.      UC Treatments / Results  Labs (all labs ordered are listed, but only abnormal results are displayed) Labs Reviewed - No data to display  EKG   Radiology No results found.  Procedures Procedures (including critical care time)  Medications Ordered in UC Medications - No data to display  Initial Impression / Assessment and Plan / UC Course  I have reviewed the triage vital signs and the nursing notes.  Pertinent labs & imaging results that were available during my care of the patient were reviewed by me and considered in my medical decision making (see chart for details).     Suspect that tampons came out with bowel movement or related to bleeding. No visible or palpable tampons on exam. Return precautions provided. Patient verbalized understanding and agreeable to plan.   Final Clinical Impressions(s) / UC Diagnoses   Final diagnoses:  Vaginal bleeding     Discharge Instructions     I did not see or feel any tampon to the vagina today.  Please return or follow with gynecology for any new or worsening of symptoms.     ED Prescriptions    None     PDMP not reviewed this encounter.   Zigmund Gottron, NP 06/07/20 [id number]

## 2020-06-24 IMAGING — DX PORTABLE CHEST - 1 VIEW
1 series · 1 of 1 positions shown · non-contrast
Comparison: Chest x-ray 05/22/2008.

CLINICAL DATA: 42-year-old female with history of cough for the
past couple of weeks. Contact with [id number] positive patients.

EXAM:
PORTABLE CHEST 1 VIEW

[chest ap]
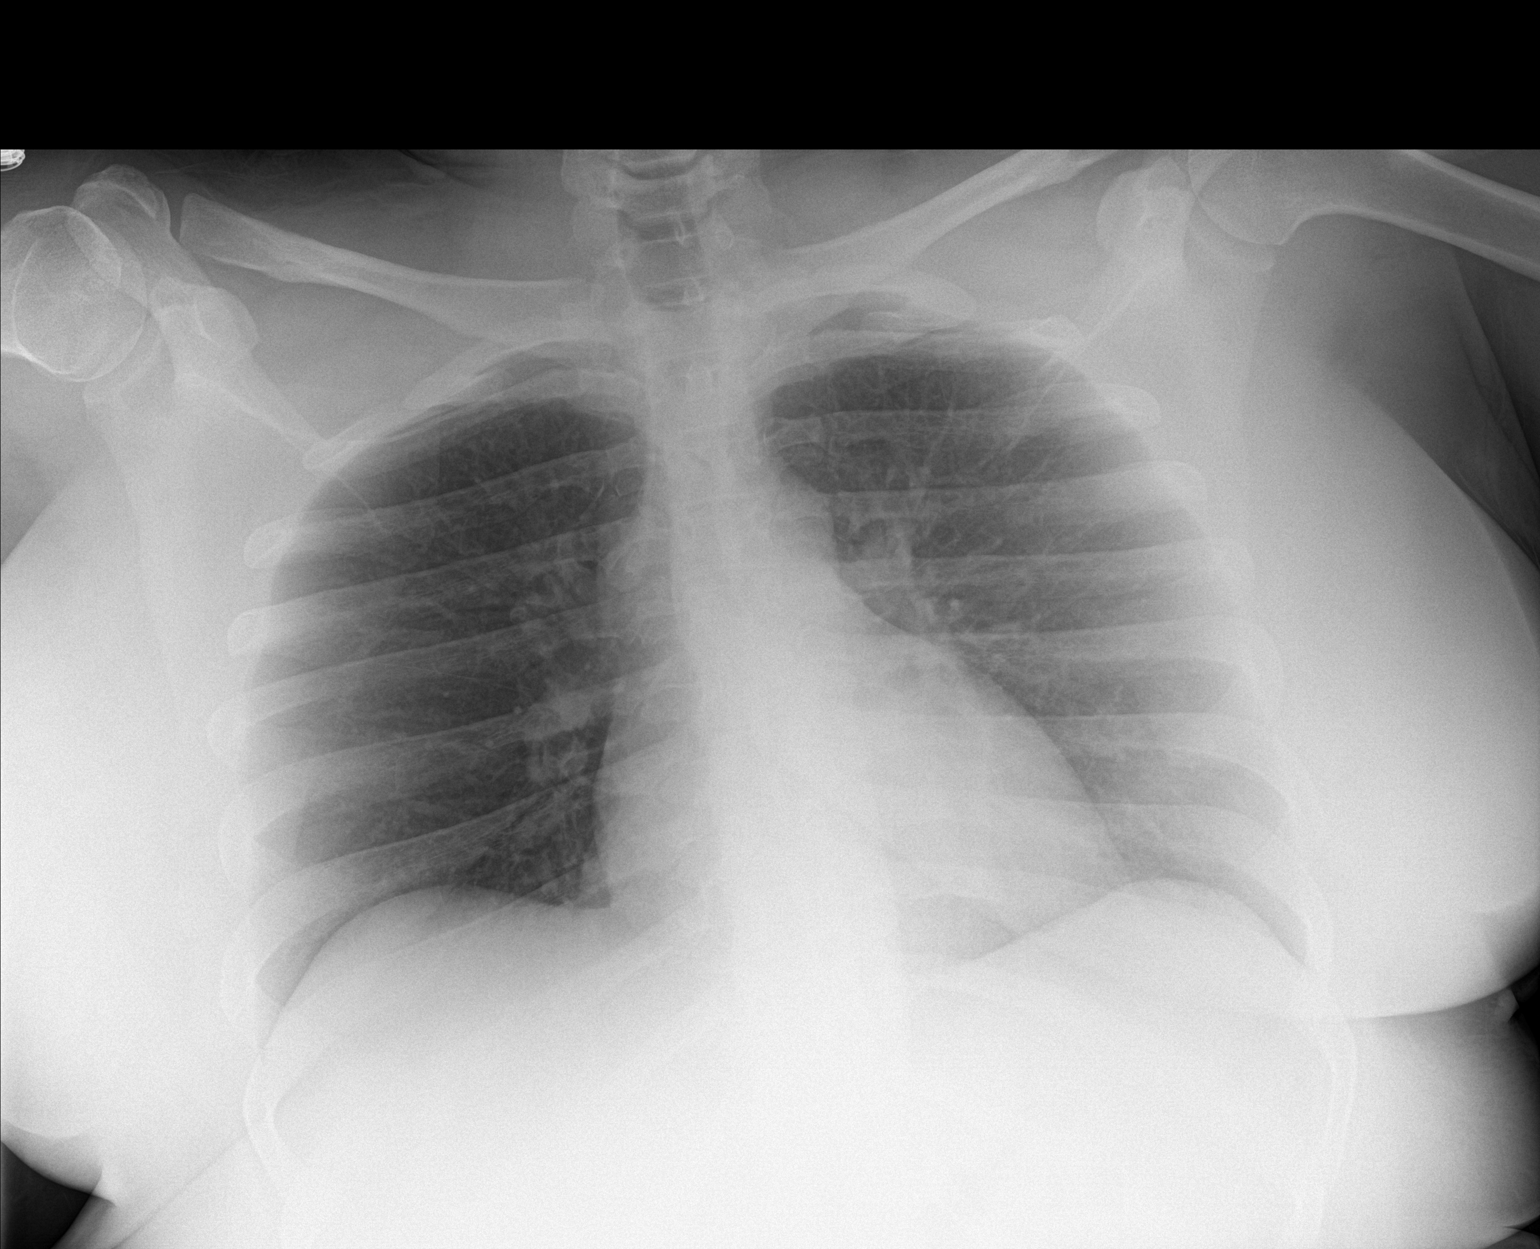

[1 of 1 positions shown; findings below may reference images not displayed]

FINDINGS: Lung volumes are normal. No consolidative airspace disease. No
pleural effusions. No pneumothorax. No pulmonary nodule or mass
noted. Pulmonary vasculature and the cardiomediastinal silhouette
are within normal limits.
IMPRESSION: No radiographic evidence of acute cardiopulmonary disease.

## 2020-08-31 ENCOUNTER — Emergency Department (HOSPITAL_COMMUNITY)
Admission: EM | Admit: 2020-08-31 | Discharge: 2020-09-05 | Disposition: E | Payer: Medicaid Other | Attending: Emergency Medicine | Admitting: Emergency Medicine

## 2020-08-31 DIAGNOSIS — I469 Cardiac arrest, cause unspecified: Secondary | ICD-10-CM

## 2020-08-31 DIAGNOSIS — F17210 Nicotine dependence, cigarettes, uncomplicated: Secondary | ICD-10-CM | POA: Insufficient documentation

## 2020-08-31 MED ORDER — SODIUM CHLORIDE 0.9 % IV SOLN
INTRAVENOUS | Status: AC | PRN
  Administered 2020-08-31 (×2): 1000 mL via INTRAVENOUS

## 2020-08-31 MED ORDER — EPINEPHRINE 1 MG/10ML IJ SOSY
PREFILLED_SYRINGE | INTRAMUSCULAR | Status: AC | PRN
  Administered 2020-08-31 (×5): 1 mg via INTRAVENOUS

## 2020-08-31 MED ORDER — TENECTEPLASE 50 MG IV KIT: 50.0000 mg | PACK | INTRAVENOUS | Status: DC

## 2020-08-31 MED ORDER — DEXTROSE 50 % IV SOLN
INTRAVENOUS | Status: AC | PRN
  Administered 2020-08-31: 2 via INTRAVENOUS

## 2020-08-31 MED ORDER — SODIUM CHLORIDE 0.9 % IV SOLN: INTRAVENOUS | Status: AC | PRN

## 2020-08-31 MED ORDER — SODIUM CHLORIDE 0.9 % IV SOLN: INTRAVENOUS | Status: DC

## 2020-08-31 MED ORDER — EPINEPHRINE 1 MG/10ML IJ SOSY: PREFILLED_SYRINGE | INTRAMUSCULAR | Status: AC | PRN

## 2020-08-31 MED ORDER — CALCIUM CHLORIDE 10 % IV SOLN
INTRAVENOUS | Status: AC | PRN
  Administered 2020-08-31: 1 g via INTRAVENOUS

## 2020-08-31 MED ORDER — SODIUM BICARBONATE 8.4 % IV SOLN
INTRAVENOUS | Status: AC | PRN
  Administered 2020-08-31: 100 meq via INTRAVENOUS
  Administered 2020-08-31: 50 meq via INTRAVENOUS

## 2020-09-05 NOTE — Code Documentation (Signed)
Pulse check- CPR Resumed

## 2020-09-05 NOTE — ED Notes (Signed)
Pt intubated

## 2020-09-05 NOTE — ED Notes (Addendum)
[facility] [employer].  Spoke with Katy Apo. Referal Number: [phone].

## 2020-09-05 NOTE — Code Documentation (Signed)
Pulse check- no pulse. CPR started.

## 2020-09-05 NOTE — ED Provider Notes (Signed)
[facility] EMERGENCY DEPARTMENT Provider Note   CSN: [id number] Arrival date & time: 09/09/20  1440     History CC:  Cardiac arrest  Beverly Martinez is a 45 y.o. female presenting to ED in cardiac arrest.  History provided by EMS.  They report that they were called to the house for patient having abrupt onset of chest pain and shortness of breath.  Fire rescue was present on scene and had started face mask NRB.  Patient hyperventilating on their arrival, and they report O2 sat was 100% but maintained on 15 L NRB.  EMS reports en route to the the hospital the patient had BP approx 110/70, tachypneic, HR normal.  Patient given aspirin 324 mg by paramedics in the field.  ECG from field showing some ST elevations in aVR, and minor ST depressions in lateral leads.    Immediately upon arrival in the ED, while being transferred to patient bed, patient became unresponsive, after becoming bradycardia and then losing pulses.  Code blue activated by nursing.  CPR started at 14:57.  Dr Ashok Cordia EDP initially present at start of code and intubated the patient.  Subsequently I was present to assist and then assume care.  See ED course below.  Family reportedly en route to hospital per EMS report.  Hx of abdominoplasty on 07/30/20.  HPI     Past Medical History:  Diagnosis Date  . Abnormal Pap smear   . Infection    UTI  . Pseudotumor cerebri     Patient Active Problem List   Diagnosis Date Noted  . Numbness of tongue 10/27/2014  . Tobacco abuse 10/27/2014  . Morbid obesity ([place]) 10/27/2014  . Elevated blood pressure 10/27/2014  . Missed abortion 07/03/2013  . ALLERGIC RHINITIS 05/22/2008  . DYSPNEA 05/22/2008    Past Surgical History:  Procedure Laterality Date  . CESAREAN SECTION    . COLPOSCOPY    . DILATION AND CURETTAGE OF UTERUS    . DILATION AND EVACUATION N/A 09/07/2013   Procedure: DILATATION AND EVACUATION;  Surgeon: Guss Bunde, MD;  Location: [facility];  Service: Gynecology;  Laterality: N/A;  . INDUCED ABORTION    . LUMBAR PUNCTURE    . WISDOM TOOTH EXTRACTION       OB History    Gravida  6   Para  2   Term      Preterm  2   AB  4   Living  2     SAB  2   IAB  2   Ectopic      Multiple      Live Births  2           Family History  Problem Relation Age of Onset  . Hypertension Mother   . Cancer Maternal Grandmother        pancreatic  . Hypertension Maternal Grandmother     Social History   Tobacco Use  . Smoking status: Former Smoker    Types: Cigarettes    Quit date: 11/15/2014    Years since quitting: 5.7  . Smokeless tobacco: Never Used  Vaping Use  . Vaping Use: Never used  Substance Use Topics  . Alcohol use: Yes    Alcohol/week: 0.0 standard drinks    Comment: occasional  . Drug use: No    Home Medications Prior to Admission medications   Medication Sig Start Date End Date Taking? Authorizing Provider  acetaZOLAMIDE (DIAMOX) 250 MG tablet Take 500  mg by mouth 2 (two) times daily. 06/15/20   [provider]  Ascorbic Acid (VITAMIN C PO) Take 1 tablet by mouth daily.    [provider]  gabapentin (NEURONTIN) 400 MG capsule Take 400 mg by mouth 2 (two) times a day. 07/30/18   [provider]  TURMERIC PO Take 1 tablet by mouth daily.    [provider]  Vitamin D, Ergocalciferol, (DRISDOL) 1.25 MG (50000 UNIT) CAPS capsule Take 50,000 Units by mouth every 7 (seven) days. 06/15/20   [provider]    Allergies    Patient has no known allergies.  Review of Systems   Review of Systems  All other systems reviewed and are negative.   Physical Exam Updated Vital Signs Pulse (!) 44   Physical Exam Constitutional:      Appearance: She is obese.     Comments: Unresponsive, intubated  Cardiovascular:     Comments: No pulse Abdominal:     Comments: Firm, distended     ED Results / Procedures / Treatments   Labs (all labs ordered are  listed, but only abnormal results are displayed) Labs Reviewed  APTT  PROTIME-INR  CBC    EKG None  Radiology No results found.  Procedures .Critical Care Performed by: Wyvonnia Dusky, MD Authorized by: Wyvonnia Dusky, MD   Critical care provider statement:    Critical care time (minutes):  45   Critical care was necessary to treat or prevent imminent or life-threatening deterioration of the following conditions:  Cardiac failure   Critical care was time spent personally by me on the following activities:  Discussions with consultants, evaluation of patient's response to treatment, examination of patient, ordering and performing treatments and interventions, ordering and review of laboratory studies, ordering and review of radiographic studies, pulse oximetry, re-evaluation of patient's condition, obtaining history from patient or surrogate and review of old charts     Medications Ordered in ED Medications  0.9 %  sodium chloride infusion (has no administration in time range)  tenecteplase (TNKASE) injection 50 mg (has no administration in time range)  sodium bicarbonate injection (50 mEq Intravenous Given 09/03/20 1504)  EPINEPHrine (ADRENALIN) 1 MG/10ML injection (1 mg Intravenous Given 2020/09/03 1504)  calcium chloride injection (1 g Intravenous Given September 03, 2020 1504)  EPINEPHrine (ADRENALIN) 1 MG/10ML injection (1 mg Intravenous Given 2020-09-03 1527)  0.9 %  sodium chloride infusion (1,000 mLs Intravenous New Bag/Given 09-03-2020 1529)    ED Course  I have reviewed the triage vital signs and the nursing notes.  Pertinent labs & imaging results that were available during my care of the patient were reviewed by me and considered in my medical decision making (see chart for details).  This is a 45 year old female w/ hx of abdominoplasty on 07/30/20 presenting to ED with chest pain and shortness of breath, subsequently gone into PEA arrest immediately after arrival in the  ED.  Patient received immediate CPR by nursing staff.  She was bag ventilated briefly then intubated by Dr. Ashok Cordia.  I presented at the start of my shift at 2:50 pm and assisted, then assumed care of the patient.  The patient received multiple rounds of epinephrine.  She had a right tibial IO placed and right arm IV placed by nursing.  She received 2 L IV fluids bolused.  She received 3 amps sodium bicarbonate, as well as calcium gluconate.  She also received 2 amps of dextrose. She remained in PEA arrest on pulse  check.  The decision was made to push tenecteplase, as I had a high clinical suspicion for PE.  CPR was resumed for another 20 minutes.  Subsequently bedside cardiac ultrasound showed cardiac standstill.  The patient was declared dead at 3:18 pm.    I contacted the ME as noted below, who declined the case.  I spoke to the patient's family members including her mother and two teenage daughters, and offered my deepest condolences for their loss.  The chaplain Jaclynn Major was also present for our conversation.   Clinical Course as of 09/01/20 1030  Tue 09/25/20 I spoke to [place], [place], who has declined the case. [MT]  56 Patient's mother Ramona informed of her passing.  Her two daughters were also present. [MT]    Clinical Course User Index [MT] Dedric Ethington, Carola Rhine, MD    Final Clinical Impression(s) / ED Diagnoses Final diagnoses:  Cardiac arrest [facility])    Rx / DC Orders ED Discharge Orders    None       Wyvonnia Dusky, MD 09/01/20 1030

## 2020-09-05 NOTE — Code Documentation (Signed)
Pulse check- no pulse. CPR started.  

## 2020-09-05 NOTE — Code Documentation (Signed)
Pulse check.   

## 2020-09-05 NOTE — ED Provider Notes (Signed)
Called to bedside to pronounce [employer] On physical exam, patient had no spontaneous respirations and was unresponsive to auditory or tactile stimuli. She had no cardiac sounds on auscultation and no carotid pulse. Pupils were fixed and dilated with no response to light.   Case declined by medical examiner Organ donation service contacted by RN.  Time of Death: 3:18 pm  Carola Rhine Virda Betters    Wyvonnia Dusky, MD Sep 16, 2020 [id number]

## 2020-09-05 NOTE — Code Documentation (Signed)
Pulse Check- no pulse CPR started again.

## 2020-09-05 NOTE — Code Documentation (Signed)
CPR started.

## 2020-09-05 NOTE — ED Notes (Signed)
Chaplain called

## 2020-09-05 NOTE — Code Documentation (Signed)
Pulse check CPR resumed

## 2020-09-05 NOTE — Code Documentation (Signed)
Pulse Check, CPR resumed.

## 2020-09-05 NOTE — Progress Notes (Signed)
Responded to page to support family.  Patient deceased.  Mother and daughter and other family at bedside. Provided emotional and grief support to all at bedside. Will follow as needed. Gave family placement card to call back with funeral home information.  Jaclynn Major, [place], [facility], Pager [phone]

## 2020-09-05 NOTE — ED Notes (Signed)
RT called. Crash cart obtained. MD Steinl at bedside.

## 2020-09-05 NOTE — ED Provider Notes (Addendum)
[facility] EMERGENCY DEPARTMENT Provider Note   CSN: [id number] Arrival date & time: Sep 13, 2020  1440     History Chief complaint: unresponsive after dyspnea/chest pain.   Beverly Martinez is a 45 y.o. female.  Called to room with report 'patient just arrived to room and is not responsive'.   I immediately went to room, patient on stretcher, supine, EMS bagging patient at head of bed.  EMS reported abdominoplasty surgery last month, and acute sob/chest pain, and that patient very agitated/distressed on arrival, and that on transferring to stretcher became unresponsive.  Dr Langston Masker, on coming [place] also present - refer to his note for full H and P.  I assisted with code/intubation.   The history is provided by the patient and the EMS personnel. The history is limited by the condition of the patient.       Past Medical History:  Diagnosis Date  . Abnormal Pap smear   . Infection    UTI  . Pseudotumor cerebri     Patient Active Problem List   Diagnosis Date Noted  . Numbness of tongue 10/27/2014  . Tobacco abuse 10/27/2014  . Morbid obesity ([place]) 10/27/2014  . Elevated blood pressure 10/27/2014  . Missed abortion 07/03/2013  . ALLERGIC RHINITIS 05/22/2008  . DYSPNEA 05/22/2008    Past Surgical History:  Procedure Laterality Date  . CESAREAN SECTION    . COLPOSCOPY    . DILATION AND CURETTAGE OF UTERUS    . DILATION AND EVACUATION N/A 09/07/2013   Procedure: DILATATION AND EVACUATION;  Surgeon: Guss Bunde, MD;  Location: [facility];  Service: Gynecology;  Laterality: N/A;  . INDUCED ABORTION    . LUMBAR PUNCTURE    . WISDOM TOOTH EXTRACTION       OB History    Gravida  6   Para  2   Term      Preterm  2   AB  4   Living  2     SAB  2   IAB  2   Ectopic      Multiple      Live Births  2           Family History  Problem Relation Age of Onset  . Hypertension Mother   . Cancer Maternal Grandmother        pancreatic  .  Hypertension Maternal Grandmother     Social History   Tobacco Use  . Smoking status: Former Smoker    Types: Cigarettes    Quit date: 11/15/2014    Years since quitting: 5.7  . Smokeless tobacco: Never Used  Vaping Use  . Vaping Use: Never used  Substance Use Topics  . Alcohol use: Yes    Alcohol/week: 0.0 standard drinks    Comment: occasional  . Drug use: No    Home Medications Prior to Admission medications   Medication Sig Start Date End Date Taking? Authorizing Provider  acetaZOLAMIDE (DIAMOX) 250 MG tablet Take 500 mg by mouth 2 (two) times daily. 06/15/20   [provider]  Ascorbic Acid (VITAMIN C PO) Take 1 tablet by mouth daily.    [provider]  gabapentin (NEURONTIN) 400 MG capsule Take 400 mg by mouth 2 (two) times a day. 07/30/18   [provider]  TURMERIC PO Take 1 tablet by mouth daily.    [provider]  Vitamin D, Ergocalciferol, (DRISDOL) 1.25 MG (50000 UNIT) CAPS capsule Take 50,000 Units by mouth every  7 (seven) days. 06/15/20   [provider]    Allergies    Patient has no known allergies.  Review of Systems   Review of Systems  Unable to perform ROS: Patient unresponsive    Physical Exam Updated Vital Signs Pulseless, apneic. (pt noted w brady pulse on 'ED vitals' but that appears to be initial electrical rhythm, no pulses noted on my initial eval of pt)  Physical Exam   On arrival to room, patient apneic, being bag ventilated, no pulses with brady/PEA rhythm on monitor.  No response to stimuli. GCS 3.    ED Results / Procedures / Treatments   Labs (all labs ordered are listed, but only abnormal results are displayed) Labs Reviewed  APTT  PROTIME-INR  CBC    EKG None  Radiology No results found.  Procedures Procedure Name: Intubation Date/Time: 09/21/20 4:04 PM Performed by: Lajean Saver, MD Preoxygenation: Pre-oxygenation with 100% oxygen Ventilation: Mask ventilation without  difficulty Laryngoscope Size: Glidescope Tube size: 7.5 mm Number of attempts: 1 Placement Confirmation: ETT inserted through vocal cords under direct vision,  CO2 detector and Breath sounds checked- equal and bilateral (tube visualized through cords, bil bs and co2 color change noted) Secured at: 22 cm Tube secured with: ETT holder        Medications Ordered in ED Medications  0.9 %  sodium chloride infusion (has no administration in time range)  tenecteplase (TNKASE) injection 50 mg (has no administration in time range)  sodium bicarbonate injection (50 mEq Intravenous Given Sep 21, 2020 1504)  EPINEPHrine (ADRENALIN) 1 MG/10ML injection (1 mg Intravenous Given 2020/09/21 1511)  calcium chloride injection (1 g Intravenous Given 2020/09/21 1504)  0.9 %  sodium chloride infusion (1,000 mLs Intravenous New Bag/Given 09/21/2020 1506)  dextrose 50 % solution (2 ampules Intravenous Given 21-Sep-2020 1511)  EPINEPHrine (ADRENALIN) 1 MG/10ML injection (1 mg Intravenous Not Given 09-21-20 1527)  0.9 %  sodium chloride infusion (1,000 mLs Intravenous Not Given 09/21/20 1529)    ED Course  I have reviewed the triage vital signs and the nursing notes.  Pertinent labs & imaging results that were available during my care of the patient were reviewed by me and considered in my medical decision making (see chart for details).  Clinical Course as of 09/21/2020 1601  Tue 09/21/20 I spoke to [place], [place], who has declined the case. [MT]  1 Patient's mother Ramona informed of her passing.  Her two daughters were also present. [MT]    Clinical Course User Index [MT] Trifan, Carola Rhine, MD   MDM Rules/Calculators/A&P                          CPR started. Staff working on iv line/IO by EMS. Bag ventilated by EMS/RT in prep for intubation. EPI iv. D50 iv. Ca iv. IV ns boluses/fliuds wide open w pressure bag. Pt with 2 ivs and IO line.   Pharmacy consulted re immediate tpa for possible/suspected PE in  patient with PEA arrest following acute dyspnea w severe hypoxia/air hunger/chest pain. TPA given by pharmacy as fast as able.  Rhythm remains bradypea vs asystole.    Additional multiple rounds epi - refer to Dr Johnnye Sima note.   During CPR, breath sounds rechecked several times - bil bs with bag vent.   On pulse checks, no pulses.   Patient remains apneic, pulseless, without sign of life, bedside u/s with no cardiac squeeze.  Final Clinical Impression(s) / ED Diagnoses Final diagnoses:  Cardiac arrest [facility])    Rx / DC Orders ED Discharge Orders    None        Lajean Saver, MD September 02, 2020 [phone]

## 2020-09-05 NOTE — Code Documentation (Signed)
Pulse check.  No pulse.  CPR resumed.   

## 2020-09-05 NOTE — Code Documentation (Signed)
TNK given in IO

## 2020-09-05 NOTE — Code Documentation (Signed)
Patient time of death occurred at 3:18

## 2020-09-05 DEATH — deceased
